# Patient Record
Sex: Male | Born: 1965
Health system: Southern US, Community
[De-identification: ages and names within clinical notes are randomized; demographics above are authoritative.]

## PROBLEM LIST (undated history)

## (undated) DIAGNOSIS — Z8619 Personal history of other infectious and parasitic diseases: Secondary | ICD-10-CM

## (undated) DIAGNOSIS — Z87442 Personal history of urinary calculi: Secondary | ICD-10-CM

## (undated) DIAGNOSIS — N189 Chronic kidney disease, unspecified: Secondary | ICD-10-CM

## (undated) DIAGNOSIS — F17200 Nicotine dependence, unspecified, uncomplicated: Secondary | ICD-10-CM

## (undated) HISTORY — DX: Personal history of other infectious and parasitic diseases: Z86.19

## (undated) HISTORY — DX: Nicotine dependence, unspecified, uncomplicated: F17.200

## (undated) HISTORY — DX: Chronic kidney disease, unspecified: N18.9

## (undated) HISTORY — PX: EYE SURGERY: SHX253

## (undated) HISTORY — DX: Personal history of urinary calculi: Z87.442

---

## 2010-12-08 DIAGNOSIS — Z87442 Personal history of urinary calculi: Secondary | ICD-10-CM

## 2010-12-08 HISTORY — DX: Personal history of urinary calculi: Z87.442

## 2011-11-09 ENCOUNTER — Emergency Department: Payer: Self-pay | Admitting: *Deleted

## 2013-03-08 HISTORY — PX: LITHOTRIPSY: SUR834

## 2013-03-17 ENCOUNTER — Ambulatory Visit: Payer: Self-pay | Admitting: Urology

## 2013-03-31 ENCOUNTER — Encounter: Payer: Self-pay | Admitting: Family Medicine

## 2013-03-31 ENCOUNTER — Ambulatory Visit (INDEPENDENT_AMBULATORY_CARE_PROVIDER_SITE_OTHER): Payer: BC Managed Care – PPO | Admitting: Family Medicine

## 2013-03-31 VITALS — BP 108/64 | HR 72 | Temp 98.4°F | Ht 69.5 in | Wt 155.2 lb

## 2013-03-31 DIAGNOSIS — Z87442 Personal history of urinary calculi: Secondary | ICD-10-CM

## 2013-03-31 DIAGNOSIS — Z87891 Personal history of nicotine dependence: Secondary | ICD-10-CM | POA: Insufficient documentation

## 2013-03-31 DIAGNOSIS — F17200 Nicotine dependence, unspecified, uncomplicated: Secondary | ICD-10-CM | POA: Insufficient documentation

## 2013-03-31 DIAGNOSIS — Z0000 Encounter for general adult medical examination without abnormal findings: Secondary | ICD-10-CM

## 2013-03-31 NOTE — Assessment & Plan Note (Signed)
Encouraged smoking cessation, discussed different methods to quit. Pt currently using e Cig. Provided with nicotrol inhaler samples.

## 2013-03-31 NOTE — Progress Notes (Signed)
Subjective:    Patient ID: Derrick Young, male    DOB: 06-16-1966, 47 y.o.   MRN: [id number]  HPI CC: new pt to establish, would like CPE  H/o kidney stones, started 11/2011.  Started left side, then had one on right side.  Latest 03/2013 s/p lithotripsy (Dr. Sheppard Penton).  Pt collected stones and pending analysis.  Intermittent lower back pain.  Improves with exercise.  Smoking - 1 ppd for 30 years.  Tried ecig, helps some.  May consider nicotine inhaler.  Preventative: No CPE in last 3 years Unsure last tetanus shot.  >10 yrs ago. Flu shots- doesn't take  sunscreen use discussed. Seat belt use discussed.  "[name]" From [place] Caffeine: 4-6 cups coffee/day Lives with wife, 3 children, 1 cat Occupation: Personal assistant, [employer]: PhD Activity: soccer Diet: seldom water, fruits/vegetables daily, seldom fast food, fish occasional  Medications and allergies reviewed and updated in chart.  Past histories reviewed and updated if relevant as below. There is no problem list on file for this patient.  Past Medical History  Diagnosis Date  . History of nephrolithiasis 2012  . History of chicken pox    Past Surgical History  Procedure Laterality Date  . Lithotripsy  03/2013   History  Substance Use Topics  . Smoking status: Current Every Day Smoker -- 1.00 packs/day    Types: Cigarettes    Start date: 12/09/1983  . Smokeless tobacco: Never Used  . Alcohol Use: No   Family History  Problem Relation Age of Onset  . Cancer Paternal Uncle     lung (smoker)  . Cancer Paternal Uncle     throat  . Emphysema Father     smoker  . Urolithiasis Mother   . CAD Neg Hx   . Stroke Neg Hx   . Diabetes Neg Hx   . Hypertension Neg Hx    No Known Allergies No current outpatient prescriptions on file prior to visit.   No current facility-administered medications on file prior to visit.     Review of Systems  Constitutional: Negative for fever, chills, activity change, appetite  change, fatigue and unexpected weight change.  HENT: Negative for hearing loss and neck pain.   Eyes: Negative for visual disturbance.  Respiratory: Negative for cough, chest tightness, shortness of breath and wheezing.   Cardiovascular: Negative for chest pain, palpitations and leg swelling.  Gastrointestinal: Positive for abdominal pain (recent kidney stones). Negative for nausea, vomiting, diarrhea, constipation, blood in stool and abdominal distention.  Genitourinary: Positive for hematuria (kidney sotne). Negative for difficulty urinating.  Musculoskeletal: Negative for myalgias and arthralgias.  Skin: Negative for rash.  Neurological: Negative for dizziness, seizures, syncope and headaches.  Hematological: Negative for adenopathy. Does not bruise/bleed easily.  Psychiatric/Behavioral: Negative for dysphoric mood. The patient is not nervous/anxious.        Objective:   Physical Exam  Nursing note and vitals reviewed. Constitutional: He is oriented to person, place, and time. He appears well-developed and well-nourished. No distress.  HENT:  Head: Normocephalic and atraumatic.  Right Ear: Hearing, tympanic membrane, external ear and ear canal normal.  Left Ear: Hearing, tympanic membrane, external ear and ear canal normal.  Nose: Nose normal.  Mouth/Throat: Oropharynx is clear and moist. No oropharyngeal exudate.  Eyes: Conjunctivae and EOM are normal. Pupils are equal, round, and reactive to light. No scleral icterus.  Neck: Normal range of motion. Neck supple. No thyromegaly present.  Cardiovascular: Normal rate, regular rhythm, normal heart sounds and intact distal  pulses.   No murmur heard. Pulses:      Radial pulses are 2+ on the right side, and 2+ on the left side.  Pulmonary/Chest: Effort normal and breath sounds normal. No respiratory distress. He has no wheezes. He has no rales.  Abdominal: Soft. Bowel sounds are normal. He exhibits no distension and no mass. There is no  tenderness. There is no rebound and no guarding.  Musculoskeletal: Normal range of motion. He exhibits no edema.  Lymphadenopathy:    He has no cervical adenopathy.  Neurological: He is alert and oriented to person, place, and time.  CN grossly intact, station and gait intact  Skin: Skin is warm and dry. No rash noted.  Psychiatric: He has a normal mood and affect. His behavior is normal. Judgment and thought content normal.       Assessment & Plan:

## 2013-03-31 NOTE — Assessment & Plan Note (Signed)
ABO/RH per pt request - aware insurance will likely not cover this. Preventative protocols reviewed and updated unless pt declined. Discussed healthy diet and lifestyle.

## 2013-03-31 NOTE — Assessment & Plan Note (Signed)
Followed by urology.   

## 2013-03-31 NOTE — Patient Instructions (Signed)
Good to see you today, call us with questions. Return at your convenience fasting for blood work. Nicotine inhaler provided today. Increase water in diet.

## 2013-04-04 ENCOUNTER — Other Ambulatory Visit: Payer: BC Managed Care – PPO

## 2013-04-07 ENCOUNTER — Other Ambulatory Visit (INDEPENDENT_AMBULATORY_CARE_PROVIDER_SITE_OTHER): Payer: BC Managed Care – PPO

## 2013-04-07 DIAGNOSIS — Z87442 Personal history of urinary calculi: Secondary | ICD-10-CM

## 2013-04-07 DIAGNOSIS — Z0000 Encounter for general adult medical examination without abnormal findings: Secondary | ICD-10-CM

## 2013-04-07 LAB — COMPREHENSIVE METABOLIC PANEL
AST: 16 U/L (ref 0–37)
BUN: 11 mg/dL (ref 6–23)
Calcium: 9.1 mg/dL (ref 8.4–10.5)
Chloride: 107 mEq/L (ref 96–112)
Creatinine, Ser: 0.9 mg/dL (ref 0.4–1.5)
Glucose, Bld: 89 mg/dL (ref 70–99)

## 2013-04-07 LAB — LIPID PANEL
Cholesterol: 186 mg/dL (ref 0–200)
LDL Cholesterol: 123 mg/dL — ABNORMAL HIGH (ref 0–99)
Triglycerides: 104 mg/dL (ref 0.0–149.0)
VLDL: 20.8 mg/dL (ref 0.0–40.0)

## 2013-04-08 LAB — ABO AND RH: Rh Type: POSITIVE

## 2013-04-11 ENCOUNTER — Encounter: Payer: Self-pay | Admitting: *Deleted

## 2015-10-01 ENCOUNTER — Ambulatory Visit (INDEPENDENT_AMBULATORY_CARE_PROVIDER_SITE_OTHER): Payer: BLUE CROSS/BLUE SHIELD | Admitting: Family Medicine

## 2015-10-01 ENCOUNTER — Encounter: Payer: Self-pay | Admitting: Family Medicine

## 2015-10-01 VITALS — BP 108/60 | HR 64 | Temp 97.7°F | Ht 69.5 in | Wt 154.5 lb

## 2015-10-01 DIAGNOSIS — F17200 Nicotine dependence, unspecified, uncomplicated: Secondary | ICD-10-CM

## 2015-10-01 DIAGNOSIS — Z13220 Encounter for screening for lipoid disorders: Secondary | ICD-10-CM

## 2015-10-01 DIAGNOSIS — Z131 Encounter for screening for diabetes mellitus: Secondary | ICD-10-CM | POA: Diagnosis not present

## 2015-10-01 DIAGNOSIS — Z0000 Encounter for general adult medical examination without abnormal findings: Secondary | ICD-10-CM | POA: Diagnosis not present

## 2015-10-01 DIAGNOSIS — Z23 Encounter for immunization: Secondary | ICD-10-CM

## 2015-10-01 LAB — LIPID PANEL
CHOL/HDL RATIO: 4
CHOLESTEROL: 170 mg/dL (ref 0–200)
HDL: 44.1 mg/dL (ref >39.00)
LDL CALC: 114 mg/dL — AB (ref 0–99)
NONHDL: 126.34
TRIGLYCERIDES: 60 mg/dL (ref 0.0–149.0)
VLDL: 12 mg/dL (ref 0.0–40.0)

## 2015-10-01 LAB — BASIC METABOLIC PANEL
BUN: 16 mg/dL (ref 6–23)
CALCIUM: 9.3 mg/dL (ref 8.4–10.5)
CO2: 29 mEq/L (ref 19–32)
Chloride: 108 mEq/L (ref 96–112)
Creatinine, Ser: 0.9 mg/dL (ref 0.40–1.50)
GFR: 95.67 mL/min (ref >60.00)
Glucose, Bld: 94 mg/dL (ref 70–99)
POTASSIUM: 4.3 meq/L (ref 3.5–5.1)
SODIUM: 142 meq/L (ref 135–145)

## 2015-10-01 NOTE — Addendum Note (Signed)
Addended by: Josph MachoANCE, Dina Mobley A on: 10/01/2015 09:17 AM   Modules accepted: Orders

## 2015-10-01 NOTE — Assessment & Plan Note (Signed)
Preventative protocols reviewed and updated unless pt declined. Discussed healthy diet and lifestyle.  

## 2015-10-01 NOTE — Patient Instructions (Addendum)
Tdap today. Flu shot today. labwork today Return as needed or in 1-2 years for next physical.  Health Maintenance, Male A healthy lifestyle and preventative care can promote health and wellness.  Maintain regular health, dental, and eye exams.  Eat a healthy diet. Foods like vegetables, fruits, whole grains, low-fat dairy products, and lean protein foods contain the nutrients you need and are low in calories. Decrease your intake of foods high in solid fats, added sugars, and salt. Get information about a proper diet from your health care provider, if necessary.  Regular physical exercise is one of the most important things you can do for your health. Most adults should get at least 150 minutes of moderate-intensity exercise (any activity that increases your heart rate and causes you to sweat) each week. In addition, most adults need muscle-strengthening exercises on 2 or more days a week.   Maintain a healthy weight. The body mass index (BMI) is a screening tool to identify possible weight problems. It provides an estimate of body fat based on height and weight. Your health care provider can find your BMI and can help you achieve or maintain a healthy weight. For males 20 years and older:  A BMI below 18.5 is considered underweight.  A BMI of 18.5 to 24.9 is normal.  A BMI of 25 to 29.9 is considered overweight.  A BMI of 30 and above is considered obese.  Maintain normal blood lipids and cholesterol by exercising and minimizing your intake of saturated fat. Eat a balanced diet with plenty of fruits and vegetables. Blood tests for lipids and cholesterol should begin at age 48 and be repeated every 5 years. If your lipid or cholesterol levels are high, you are over age 48, or you are at high risk for heart disease, you may need your cholesterol levels checked more frequently.Ongoing high lipid and cholesterol levels should be treated with medicines if diet and exercise are not  working.  If you smoke, find out from your health care provider how to quit. If you do not use tobacco, do not start.  Lung cancer screening is recommended for adults aged 55-80 years who are at high risk for developing lung cancer because of a history of smoking. A yearly low-dose CT scan of the lungs is recommended for people who have at least a 30-pack-year history of smoking and are current smokers or have quit within the past 15 years. A pack year of smoking is smoking an average of 1 pack of cigarettes a day for 1 year (for example, a 30-pack-year history of smoking could mean smoking 1 pack a day for 30 years or 2 packs a day for 15 years). Yearly screening should continue until the smoker has stopped smoking for at least 15 years. Yearly screening should be stopped for people who develop a health problem that would prevent them from having lung cancer treatment.  If you choose to drink alcohol, do not have more than 2 drinks per day. One drink is considered to be 12 oz (360 mL) of beer, 5 oz (150 mL) of wine, or 1.5 oz (45 mL) of liquor.  Avoid the use of street drugs. Do not share needles with anyone. Ask for help if you need support or instructions about stopping the use of drugs.  High blood pressure causes heart disease and increases the risk of stroke. High blood pressure is more likely to develop in:  People who have blood pressure in the end of the normal  range (100-139/85-89 mm Hg).  People who are overweight or obese.  People who are African American.  If you are 82-40 years of age, have your blood pressure checked every 3-5 years. If you are 57 years of age or older, have your blood pressure checked every year. You should have your blood pressure measured twice--once when you are at a hospital or clinic, and once when you are not at a hospital or clinic. Record the average of the two measurements. To check your blood pressure when you are not at a hospital or clinic, you can  use:  An automated blood pressure machine at a pharmacy.  A home blood pressure monitor.  If you are 55-66 years old, ask your health care provider if you should take aspirin to prevent heart disease.  Diabetes screening involves taking a blood sample to check your fasting blood sugar level. This should be done once every 3 years after age 14 if you are at a normal weight and without risk factors for diabetes. Testing should be considered at a younger age or be carried out more frequently if you are overweight and have at least 1 risk factor for diabetes.  Colorectal cancer can be detected and often prevented. Most routine colorectal cancer screening begins at the age of 47 and continues through age 15. However, your health care provider may recommend screening at an earlier age if you have risk factors for colon cancer. On a yearly basis, your health care provider may provide home test kits to check for hidden blood in the stool. A small camera at the end of a tube may be used to directly examine the colon (sigmoidoscopy or colonoscopy) to detect the earliest forms of colorectal cancer. Talk to your health care provider about this at age 51 when routine screening begins. A direct exam of the colon should be repeated every 5-10 years through age 56, unless early forms of precancerous polyps or small growths are found.  People who are at an increased risk for hepatitis B should be screened for this virus. You are considered at high risk for hepatitis B if:  You were born in a country where hepatitis B occurs often. Talk with your health care provider about which countries are considered high risk.  Your parents were born in a high-risk country and you have not received a shot to protect against hepatitis B (hepatitis B vaccine).  You have HIV or AIDS.  You use needles to inject street drugs.  You live with, or have sex with, someone who has hepatitis B.  You are a man who has sex with other  men (MSM).  You get hemodialysis treatment.  You take certain medicines for conditions like cancer, organ transplantation, and autoimmune conditions.  Hepatitis C blood testing is recommended for all people born from [birth year] through 1965 and any individual with known risk factors for hepatitis C.  Healthy men should no longer receive prostate-specific antigen (PSA) blood tests as part of routine cancer screening. Talk to your health care provider about prostate cancer screening.  Testicular cancer screening is not recommended for adolescents or adult males who have no symptoms. Screening includes self-exam, a health care provider exam, and other screening tests. Consult with your health care provider about any symptoms you have or any concerns you have about testicular cancer.  Practice safe sex. Use condoms and avoid high-risk sexual practices to reduce the spread of sexually transmitted infections (STIs).  You should be screened for STIs,  including gonorrhea and chlamydia if:  You are sexually active and are younger than 24 years.  You are older than 24 years, and your health care provider tells you that you are at risk for this type of infection.  Your sexual activity has changed since you were last screened, and you are at an increased risk for chlamydia or gonorrhea. Ask your health care provider if you are at risk.  If you are at risk of being infected with HIV, it is recommended that you take a prescription medicine daily to prevent HIV infection. This is called pre-exposure prophylaxis (PrEP). You are considered at risk if:  You are a man who has sex with other men (MSM).  You are a heterosexual man who is sexually active with multiple partners.  You take drugs by injection.  You are sexually active with a partner who has HIV.  Talk with your health care provider about whether you are at high risk of being infected with HIV. If you choose to begin PrEP, you should first be tested  for HIV. You should then be tested every 3 months for as long as you are taking PrEP.  Use sunscreen. Apply sunscreen liberally and repeatedly throughout the day. You should seek shade when your shadow is shorter than you. Protect yourself by wearing long sleeves, pants, a wide-brimmed hat, and sunglasses year round whenever you are outdoors.  Tell your health care provider of new moles or changes in moles, especially if there is a change in shape or color. Also, tell your health care provider if a mole is larger than the size of a pencil eraser.  A one-time screening for abdominal aortic aneurysm (AAA) and surgical repair of large AAAs by ultrasound is recommended for men aged 33-75 years who are current or former smokers.  Stay current with your vaccines (immunizations).   This information is not intended to replace advice given to you by your health care provider. Make sure you discuss any questions you have with your health care provider.   Document Released: 05/22/2008 Document Revised: 12/15/2014 Document Reviewed: 04/21/2011 Elsevier Interactive Patient Education [employer].

## 2015-10-01 NOTE — Progress Notes (Signed)
Pre visit review using our clinic review tool, if applicable. No additional management support is needed unless otherwise documented below in the visit note. 

## 2015-10-01 NOTE — Progress Notes (Signed)
BP 108/60 mmHg  Pulse 64  Temp(Src) 97.7 F (36.5 C) (Oral)  Ht 5' 9.5" (1.765 m)  Wt 154 lb 8 oz (70.081 kg)  BMI 22.50 kg/m2   CC: CPE  Subjective:    Patient ID: Derrick Young, male    DOB: 12/29/1965, 49 y.o.   MRN: [id number]  HPI: Derrick HopeSalem Giambra is a 49 y.o. male presenting on 10/01/2015 for Annual Exam   Feeling under the weather today - 3d h/o congestion, sore throat, cough. Doesn't frequently get colds. Some ongoing smoker's cough with mild phlegm production. Discussed possible spirometry in the future.  Smoking - 1 ppd for 30 years. Using vapor products.   Preventative: fmhx lung cancer, pt smoker. Will qualify for lung cancer screening age 48yo. Tdap today Flu shots- today.  Seat belt use discussed. No changing moles on the skin.   "[name]" From [place] Caffeine: 4-6 cups coffee/day Lives with wife, 3 children, 1 cat Occupation: Personal assistantlab manager, [employer]: PhD  Activity: soccer  Diet: seldom water, fruits/vegetables daily, seldom fast food, fish occasional   Relevant past medical, surgical, family and social history reviewed and updated as indicated. Interim medical history since our last visit reviewed. Allergies and medications reviewed and updated. No current outpatient prescriptions on file prior to visit.   No current facility-administered medications on file prior to visit.    Review of Systems  Constitutional: Negative for fever, chills, activity change, appetite change, fatigue and unexpected weight change.  HENT: Negative for hearing loss.   Eyes: Negative for visual disturbance.  Respiratory: Positive for cough (intermittent ?smoker's - with phlegm ) and wheezing (rare). Negative for chest tightness and shortness of breath.   Cardiovascular: Negative for chest pain, palpitations and leg swelling.  Gastrointestinal: Negative for nausea, vomiting, abdominal pain, diarrhea, constipation, blood in stool and abdominal distention.  Genitourinary:  Negative for hematuria and difficulty urinating.  Musculoskeletal: Negative for myalgias, arthralgias and neck pain.  Skin: Negative for rash.  Neurological: Negative for dizziness, seizures, syncope and headaches.  Hematological: Negative for adenopathy. Does not bruise/bleed easily.  Psychiatric/Behavioral: Negative for dysphoric mood. The patient is not nervous/anxious.    Per HPI unless specifically indicated in ROS section     Objective:    BP 108/60 mmHg  Pulse 64  Temp(Src) 97.7 F (36.5 C) (Oral)  Ht 5' 9.5" (1.765 m)  Wt 154 lb 8 oz (70.081 kg)  BMI 22.50 kg/m2  Wt Readings from Last 3 Encounters:  10/01/15 154 lb 8 oz (70.081 kg)  03/31/13 155 lb 4 oz (70.421 kg)    Physical Exam  Constitutional: He is oriented to person, place, and time. He appears well-developed and well-nourished. No distress.  HENT:  Head: Normocephalic and atraumatic.  Right Ear: Hearing, tympanic membrane, external ear and ear canal normal.  Left Ear: Hearing, tympanic membrane, external ear and ear canal normal.  Nose: Nose normal.  Mouth/Throat: Uvula is midline, oropharynx is clear and moist and mucous membranes are normal. No oropharyngeal exudate, posterior oropharyngeal edema or posterior oropharyngeal erythema.  Eyes: Conjunctivae and EOM are normal. Pupils are equal, round, and reactive to light. No scleral icterus.  Neck: Normal range of motion. Neck supple. No thyromegaly present.  Cardiovascular: Normal rate, regular rhythm, normal heart sounds and intact distal pulses.   No murmur heard. Pulses:      Radial pulses are 2+ on the right side, and 2+ on the left side.  Pulmonary/Chest: Effort normal and breath sounds normal. No respiratory distress.  He has no wheezes. He has no rales.  Abdominal: Soft. Bowel sounds are normal. He exhibits no distension and no mass. There is no tenderness. There is no rebound and no guarding.  Musculoskeletal: Normal range of motion. He exhibits no edema.   Lymphadenopathy:    He has no cervical adenopathy.  Neurological: He is alert and oriented to person, place, and time.  CN grossly intact, station and gait intact  Skin: Skin is warm and dry. No rash noted.  Psychiatric: He has a normal mood and affect. His behavior is normal. Judgment and thought content normal.  Nursing note and vitals reviewed.  Results for orders placed or performed in visit on 04/07/13  Lipid panel  Result Value Ref Range   Cholesterol 186 0 - 200 mg/dL   Triglycerides 161.0 0.0 - 149.0 mg/dL   HDL 96.04 >54.09 mg/dL   VLDL 81.1 0.0 - 91.4 mg/dL   LDL Cholesterol 782 (H) 0 - 99 mg/dL   Total CHOL/HDL Ratio 4   Comprehensive metabolic panel  Result Value Ref Range   Sodium 138 135 - 145 mEq/L   Potassium 3.9 3.5 - 5.1 mEq/L   Chloride 107 96 - 112 mEq/L   CO2 27 19 - 32 mEq/L   Glucose, Bld 89 70 - 99 mg/dL   BUN 11 6 - 23 mg/dL   Creatinine, Ser 0.9 0.4 - 1.5 mg/dL   Total Bilirubin 0.7 0.3 - 1.2 mg/dL   Alkaline Phosphatase 41 39 - 117 U/L   AST 16 0 - 37 U/L   ALT 14 0 - 53 U/L   Total Protein 6.8 6.0 - 8.3 g/dL   Albumin 3.9 3.5 - 5.2 g/dL   Calcium 9.1 8.4 - 95.6 mg/dL   GFR 21.30 >86.57 mL/min  ABO AND RH   Result Value Ref Range   ABO Grouping O    Rh Type POS       Assessment & Plan:   Problem List Items Addressed This Visit    Smoker    Continue to encourage cessation. Contemplative.  Discussed return for spirometry if any dyspnea.      Healthcare maintenance - Primary    Preventative protocols reviewed and updated unless pt declined. Discussed healthy diet and lifestyle.        Other Visit Diagnoses    Screening, lipid        Relevant Orders    Lipid panel    Screening for diabetes mellitus        Relevant Orders    Basic metabolic panel        Follow up plan: Return in about 2 years (around 09/30/2017), or as needed, for annual exam, prior fasting for blood work.

## 2015-10-01 NOTE — Assessment & Plan Note (Addendum)
Continue to encourage cessation. Contemplative.  Discussed return for spirometry if any dyspnea.

## 2016-11-08 DIAGNOSIS — M940 Chondrocostal junction syndrome [Tietze]: Secondary | ICD-10-CM | POA: Diagnosis not present

## 2016-12-16 ENCOUNTER — Encounter: Payer: Self-pay | Admitting: Family Medicine

## 2016-12-16 ENCOUNTER — Ambulatory Visit (INDEPENDENT_AMBULATORY_CARE_PROVIDER_SITE_OTHER): Payer: BLUE CROSS/BLUE SHIELD | Admitting: Family Medicine

## 2016-12-16 VITALS — BP 100/60 | HR 70 | Temp 98.3°F | Wt 155.2 lb

## 2016-12-16 DIAGNOSIS — M545 Low back pain, unspecified: Secondary | ICD-10-CM

## 2016-12-16 DIAGNOSIS — S76211A Strain of adductor muscle, fascia and tendon of right thigh, initial encounter: Secondary | ICD-10-CM | POA: Diagnosis not present

## 2016-12-16 DIAGNOSIS — M79604 Pain in right leg: Secondary | ICD-10-CM | POA: Insufficient documentation

## 2016-12-16 NOTE — Assessment & Plan Note (Signed)
New, doesn't remember mechanism of action.  Supportive care discussed, [facility] provided today. Update if not improving with treatment.

## 2016-12-16 NOTE — Progress Notes (Signed)
Pre visit review using our clinic review tool, if applicable. No additional management support is needed unless otherwise documented below in the visit note. 

## 2016-12-16 NOTE — Progress Notes (Signed)
   BP 100/60   Pulse 70   Temp 98.3 F (36.8 C) (Oral)   Wt 155 lb 4 oz (70.4 kg)   BMI 22.60 kg/m    CC: back and R leg pain Subjective:    Patient ID: Derrick Young, male    DOB: 02/24/1966, 51 y.o.   MRN: [id number]  HPI: Derrick Young is a 51 y.o. male presenting on 12/16/2016 for Back Pain (and pain right leg)   Pain started R LBP over last 1+ month, awoke in bed and while turning felt shooting pain down R leg to ankles "electric pain". Pain worse in the morning, also noted with prolonged standing (throbbing pain). Some improvement over the past week but now with inner thigh pain. Treating with ibuprofen 800mg  PRN which helps.   Denies inciting trauma/injury. No numbness/weakness of legs. No fevers/chills. No bowel/bladder accidents.   Plays soccer to stay active. Played yesterday. This pain has somewhat limited his activity.   No h/o leg or back surgeries.   Relevant past medical, surgical, family and social history reviewed and updated as indicated. Interim medical history since our last visit reviewed. Allergies and medications reviewed and updated. No current outpatient prescriptions on file prior to visit.   No current facility-administered medications on file prior to visit.     Review of Systems Per HPI unless specifically indicated in ROS section     Objective:    BP 100/60   Pulse 70   Temp 98.3 F (36.8 C) (Oral)   Wt 155 lb 4 oz (70.4 kg)   BMI 22.60 kg/m   Wt Readings from Last 3 Encounters:  12/16/16 155 lb 4 oz (70.4 kg)  10/01/15 154 lb 8 oz (70.1 kg)  03/31/13 155 lb 4 oz (70.4 kg)    Physical Exam  Constitutional: He is oriented to person, place, and time. He appears well-developed and well-nourished. No distress.  Abdominal: Hernia confirmed negative in the right inguinal area and confirmed negative in the left inguinal area.  Musculoskeletal: He exhibits no edema.  No pain midline spine No paraspinous mm tenderness Neg SLR  bilaterally. No pain with int/ext rotation at hip. Neg FABER. No pain at SIJ, GTB or sciatic notch bilaterally.  No pain or bruising inner groin. Tender with testing of hip abductors/adductors  Neurological: He is alert and oriented to person, place, and time.  5/5 strength BLE  Skin: Skin is warm and dry. No rash noted.  Nursing note and vitals reviewed.     Assessment & Plan:   Problem List Items Addressed This Visit    Lumbar pain with radiation down right leg    Anticipate lumbar strain with sciatica - now improving. Supportive care discussed. Update if recurrent or worsening.      Relevant Medications   ibuprofen (ADVIL,MOTRIN) 200 MG tablet   Strain of groin, right, initial encounter - Primary    New, doesn't remember mechanism of action.  Supportive care discussed, groin strain exercises from [facility] advisor provided today. Update if not improving with treatment.           Follow up plan: No Follow-up on file.  Eustaquio BoydenJavier Mariapaula Krist, MD

## 2016-12-16 NOTE — Assessment & Plan Note (Signed)
Anticipate lumbar strain with sciatica - now improving. Supportive care discussed. Update if recurrent or worsening.

## 2017-01-07 ENCOUNTER — Encounter: Payer: Self-pay | Admitting: Family Medicine

## 2017-01-07 ENCOUNTER — Ambulatory Visit (INDEPENDENT_AMBULATORY_CARE_PROVIDER_SITE_OTHER): Payer: BLUE CROSS/BLUE SHIELD | Admitting: Family Medicine

## 2017-01-07 VITALS — BP 108/60 | HR 68 | Temp 98.2°F | Ht 69.5 in | Wt 159.8 lb

## 2017-01-07 DIAGNOSIS — B351 Tinea unguium: Secondary | ICD-10-CM

## 2017-01-07 DIAGNOSIS — F17200 Nicotine dependence, unspecified, uncomplicated: Secondary | ICD-10-CM

## 2017-01-07 DIAGNOSIS — S76211A Strain of adductor muscle, fascia and tendon of right thigh, initial encounter: Secondary | ICD-10-CM

## 2017-01-07 DIAGNOSIS — Z0000 Encounter for general adult medical examination without abnormal findings: Secondary | ICD-10-CM

## 2017-01-07 MED ORDER — TERBINAFINE HCL 250 MG PO TABS: 250.0000 mg | ORAL_TABLET | Freq: Every day | ORAL | 0 refills | Status: DC

## 2017-01-07 NOTE — Patient Instructions (Addendum)
You are doing well. Stay active, work on water.  Treat fungal nail infection with lamisil medicine daily. Let us know effect.   Health Maintenance, Male A healthy lifestyle and preventative care can promote health and wellness.  Maintain regular health, dental, and eye exams.  Eat a healthy diet. Foods like vegetables, fruits, whole grains, low-fat dairy products, and lean protein foods contain the nutrients you need and are low in calories. Decrease your intake of foods high in solid fats, added sugars, and salt. Get information about a proper diet from your health care provider, if necessary.  Regular physical exercise is one of the most important things you can do for your health. Most adults should get at least 150 minutes of moderate-intensity exercise (any activity that increases your heart rate and causes you to sweat) each week. In addition, most adults need muscle-strengthening exercises on 2 or more days a week.   Maintain a healthy weight. The body mass index (BMI) is a screening tool to identify possible weight problems. It provides an estimate of body fat based on height and weight. Your health care provider can find your BMI and can help you achieve or maintain a healthy weight. For males 20 years and older:  A BMI below 18.5 is considered underweight.  A BMI of 18.5 to 24.9 is normal.  A BMI of 25 to 29.9 is considered overweight.  A BMI of 30 and above is considered obese.  Maintain normal blood lipids and cholesterol by exercising and minimizing your intake of saturated fat. Eat a balanced diet with plenty of fruits and vegetables. Blood tests for lipids and cholesterol should begin at age 50 and be repeated every 5 years. If your lipid or cholesterol levels are high, you are over age 750, or you are at high risk for heart disease, you may need your cholesterol levels checked more frequently.Ongoing high lipid and cholesterol levels should be treated with medicines if diet  and exercise are not working.  If you smoke, find out from your health care provider how to quit. If you do not use tobacco, do not start.  Lung cancer screening is recommended for adults aged 55-80 years who are at high risk for developing lung cancer because of a history of smoking. A yearly low-dose CT scan of the lungs is recommended for people who have at least a 30-pack-year history of smoking and are current smokers or have quit within the past 15 years. A pack year of smoking is smoking an average of 1 pack of cigarettes a day for 1 year (for example, a 30-pack-year history of smoking could mean smoking 1 pack a day for 30 years or 2 packs a day for 15 years). Yearly screening should continue until the smoker has stopped smoking for at least 15 years. Yearly screening should be stopped for people who develop a health problem that would prevent them from having lung cancer treatment.  If you choose to drink alcohol, do not have more than 2 drinks per day. One drink is considered to be 12 oz (360 mL) of beer, 5 oz (150 mL) of wine, or 1.5 oz (45 mL) of liquor.  Avoid the use of street drugs. Do not share needles with anyone. Ask for help if you need support or instructions about stopping the use of drugs.  High blood pressure causes heart disease and increases the risk of stroke. High blood pressure is more likely to develop in:  People who have blood pressure  in the end of the normal range (100-139/85-89 mm Hg).  People who are overweight or obese.  People who are African American.  If you are 58-21 years of age, have your blood pressure checked every 3-5 years. If you are 78 years of age or older, have your blood pressure checked every year. You should have your blood pressure measured twice-once when you are at a hospital or clinic, and once when you are not at a hospital or clinic. Record the average of the two measurements. To check your blood pressure when you are not at a hospital or  clinic, you can use:  An automated blood pressure machine at a pharmacy.  A home blood pressure monitor.  If you are 3-84 years old, ask your health care provider if you should take aspirin to prevent heart disease.  Diabetes screening involves taking a blood sample to check your fasting blood sugar level. This should be done once every 3 years after age 70 if you are at a normal weight and without risk factors for diabetes. Testing should be considered at a younger age or be carried out more frequently if you are overweight and have at least 1 risk factor for diabetes.  Colorectal cancer can be detected and often prevented. Most routine colorectal cancer screening begins at the age of 41 and continues through age 57. However, your health care provider may recommend screening at an earlier age if you have risk factors for colon cancer. On a yearly basis, your health care provider may provide home test kits to check for hidden blood in the stool. A small camera at the end of a tube may be used to directly examine the colon (sigmoidoscopy or colonoscopy) to detect the earliest forms of colorectal cancer. Talk to your health care provider about this at age 9 when routine screening begins. A direct exam of the colon should be repeated every 5-10 years through age 65, unless early forms of precancerous polyps or small growths are found.  People who are at an increased risk for hepatitis B should be screened for this virus. You are considered at high risk for hepatitis B if:  You were born in a country where hepatitis B occurs often. Talk with your health care provider about which countries are considered high risk.  Your parents were born in a high-risk country and you have not received a shot to protect against hepatitis B (hepatitis B vaccine).  You have HIV or AIDS.  You use needles to inject street drugs.  You live with, or have sex with, someone who has hepatitis B.  You are a man who has  sex with other men (MSM).  You get hemodialysis treatment.  You take certain medicines for conditions like cancer, organ transplantation, and autoimmune conditions.  Hepatitis C blood testing is recommended for all people born from [birth year] through 1964 and any individual with known risk factors for hepatitis C.  Healthy men should no longer receive prostate-specific antigen (PSA) blood tests as part of routine cancer screening. Talk to your health care provider about prostate cancer screening.  Testicular cancer screening is not recommended for adolescents or adult males who have no symptoms. Screening includes self-exam, a health care provider exam, and other screening tests. Consult with your health care provider about any symptoms you have or any concerns you have about testicular cancer.  Practice safe sex. Use condoms and avoid high-risk sexual practices to reduce the spread of sexually transmitted infections (STIs).  You should be screened for STIs, including gonorrhea and chlamydia if:  You are sexually active and are younger than 24 years.  You are older than 24 years, and your health care provider tells you that you are at risk for this type of infection.  Your sexual activity has changed since you were last screened, and you are at an increased risk for chlamydia or gonorrhea. Ask your health care provider if you are at risk.  If you are at risk of being infected with HIV, it is recommended that you take a prescription medicine daily to prevent HIV infection. This is called pre-exposure prophylaxis (PrEP). You are considered at risk if:  You are a man who has sex with other men (MSM).  You are a heterosexual man who is sexually active with multiple partners.  You take drugs by injection.  You are sexually active with a partner who has HIV.  Talk with your health care provider about whether you are at high risk of being infected with HIV. If you choose to begin PrEP, you should  first be tested for HIV. You should then be tested every 3 months for as long as you are taking PrEP.  Use sunscreen. Apply sunscreen liberally and repeatedly throughout the day. You should seek shade when your shadow is shorter than you. Protect yourself by wearing long sleeves, pants, a wide-brimmed hat, and sunglasses year round whenever you are outdoors.  Tell your health care provider of new moles or changes in moles, especially if there is a change in shape or color. Also, tell your health care provider if a mole is larger than the size of a pencil eraser.  A one-time screening for abdominal aortic aneurysm (AAA) and surgical repair of large AAAs by ultrasound is recommended for men aged 64-75 years who are current or former smokers.  Stay current with your vaccines (immunizations). This information is not intended to replace advice given to you by your health care provider. Make sure you discuss any questions you have with your health care provider. Document Released: 05/22/2008 Document Revised: 12/15/2014 Document Reviewed: 08/28/2015 Elsevier Interactive Patient Education  2017 [employer].

## 2017-01-07 NOTE — Assessment & Plan Note (Signed)
Treat with 6 wk lamisil course. Update with effect, discussed possible rpt if needed, would check LFTs if needed.

## 2017-01-07 NOTE — Assessment & Plan Note (Addendum)
Continue to encourage full cessation. Has cut down. contemplative

## 2017-01-07 NOTE — Progress Notes (Signed)
Pre visit review using our clinic review tool, if applicable. No additional management support is needed unless otherwise documented below in the visit note. 

## 2017-01-07 NOTE — Progress Notes (Signed)
BP 108/60   Pulse 68   Temp 98.2 F (36.8 C) (Oral)   Ht 5' 9.5" (1.765 m)   Wt 159 lb 12 oz (72.5 kg)   BMI 23.25 kg/m    CC: CPE Subjective:    Patient ID: Derrick Young, male    DOB: 03/06/1966, 51 y.o.   MRN: [id number]  HPI: Derrick HopeSalem Aschenbrenner is a 51 y.o. male presenting on 01/07/2017 for Annual Exam   Seen here earlier this month with lumbar pain and R groin pain - improving.  Recurrent onychomycosis over the past year R foot - would like treatment.   Preventative: Fmhx lung cancer, pt smoker. Will qualify for lung cancer screening age 50yo. Flu shots- today.  Tdap today Seat belt use discussed. Sunscreen use discussed. No changing moles on the skin.  Smoking - cut down to 1/2 ppd. Some vapes Alcohol - none  "[name]" [place]: 4-6 cups coffee/day  Lives with wife, 3 children, 1 cat Occupation: Personal assistantlab manager, [employer]: PhD  Activity: soccer  Diet: seldom water, fruits/vegetables daily, seldom fast food, fish occasional   Relevant past medical, surgical, family and social history reviewed and updated as indicated. Interim medical history since our last visit reviewed. Allergies and medications reviewed and updated. Current Outpatient Prescriptions on File Prior to Visit  Medication Sig  . ibuprofen (ADVIL,MOTRIN) 200 MG tablet Take 200 mg by mouth every 6 (six) hours as needed.   No current facility-administered medications on file prior to visit.     Review of Systems  Constitutional: Negative for activity change, appetite change, chills, fatigue, fever and unexpected weight change.  HENT: Negative for hearing loss.   Eyes: Negative for visual disturbance.  Respiratory: Negative for cough, chest tightness, shortness of breath and wheezing.   Cardiovascular: Negative for chest pain, palpitations and leg swelling.  Gastrointestinal: Negative for abdominal distention, abdominal pain, blood in stool, constipation, diarrhea, nausea and vomiting.    Genitourinary: Negative for difficulty urinating and hematuria.  Musculoskeletal: Negative for arthralgias, myalgias and neck pain.  Skin: Negative for rash.  Neurological: Negative for dizziness, seizures, syncope and headaches.  Hematological: Negative for adenopathy. Does not bruise/bleed easily.  Psychiatric/Behavioral: Negative for dysphoric mood. The patient is not nervous/anxious.    Per HPI unless specifically indicated in ROS section     Objective:    BP 108/60   Pulse 68   Temp 98.2 F (36.8 C) (Oral)   Ht 5' 9.5" (1.765 m)   Wt 159 lb 12 oz (72.5 kg)   BMI 23.25 kg/m   Wt Readings from Last 3 Encounters:  01/07/17 159 lb 12 oz (72.5 kg)  12/16/16 155 lb 4 oz (70.4 kg)  10/01/15 154 lb 8 oz (70.1 kg)    Physical Exam  Constitutional: He is oriented to person, place, and time. He appears well-developed and well-nourished. No distress.  HENT:  Head: Normocephalic and atraumatic.  Right Ear: Hearing, tympanic membrane, external ear and ear canal normal.  Left Ear: Hearing, tympanic membrane, external ear and ear canal normal.  Nose: Nose normal.  Mouth/Throat: Uvula is midline, oropharynx is clear and moist and mucous membranes are normal. No oropharyngeal exudate, posterior oropharyngeal edema or posterior oropharyngeal erythema.  Eyes: Conjunctivae and EOM are normal. Pupils are equal, round, and reactive to light. No scleral icterus.  Neck: Normal range of motion. Neck supple. No thyromegaly present.  Cardiovascular: Normal rate, regular rhythm, normal heart sounds and intact distal pulses.   No murmur heard.  Pulses:      Radial pulses are 2+ on the right side, and 2+ on the left side.  Pulmonary/Chest: Effort normal and breath sounds normal. No respiratory distress. He has no wheezes. He has no rales.  Abdominal: Soft. Bowel sounds are normal. He exhibits no distension and no mass. There is no tenderness. There is no rebound and no guarding.  Musculoskeletal:  Normal range of motion. He exhibits no edema.  Lymphadenopathy:    He has no cervical adenopathy.  Neurological: He is alert and oriented to person, place, and time.  CN grossly intact, station and gait intact  Skin: Skin is warm and dry. No rash noted.  Thickened onychomycotic nails R foot  Psychiatric: He has a normal mood and affect. His behavior is normal. Judgment and thought content normal.  Nursing note and vitals reviewed.  Results for orders placed or performed in visit on 10/01/15  Lipid panel  Result Value Ref Range   Cholesterol 170 0 - 200 mg/dL   Triglycerides 16.1 0.0 - 149.0 mg/dL   HDL 09.60 >45.40 mg/dL   VLDL 98.1 0.0 - 19.1 mg/dL   LDL Cholesterol 478 (H) 0 - 99 mg/dL   Total CHOL/HDL Ratio 4    NonHDL 126.34   Basic metabolic panel  Result Value Ref Range   Sodium 142 135 - 145 mEq/L   Potassium 4.3 3.5 - 5.1 mEq/L   Chloride 108 96 - 112 mEq/L   CO2 29 19 - 32 mEq/L   Glucose, Bld 94 70 - 99 mg/dL   BUN 16 6 - 23 mg/dL   Creatinine, Ser 2.95 0.40 - 1.50 mg/dL   Calcium 9.3 8.4 - 62.1 mg/dL   GFR 30.86 >57.84 mL/min      Assessment & Plan:   Problem List Items Addressed This Visit    Healthcare maintenance - Primary    Preventative protocols reviewed and updated unless pt declined. Discussed healthy diet and lifestyle.       Onychomycosis    Treat with 6 wk lamisil course. Update with effect, discussed possible rpt if needed, would check LFTs if needed.       Relevant Medications   terbinafine (LAMISIL) 250 MG tablet   Smoker    Continue to encourage full cessation. Has cut down. contemplative      Strain of groin, right, initial encounter    Improving.           Follow up plan: Return in about 1 year (around 01/07/2018) for annual exam, prior fasting for blood work.  [name], MD

## 2017-01-07 NOTE — Assessment & Plan Note (Addendum)
Improving.

## 2017-01-07 NOTE — Assessment & Plan Note (Signed)
Preventative protocols reviewed and updated unless pt declined. Discussed healthy diet and lifestyle.  

## 2017-02-18 ENCOUNTER — Other Ambulatory Visit: Payer: Self-pay | Admitting: Family Medicine

## 2017-12-09 ENCOUNTER — Encounter: Payer: BLUE CROSS/BLUE SHIELD | Admitting: Family Medicine

## 2018-01-05 ENCOUNTER — Ambulatory Visit (INDEPENDENT_AMBULATORY_CARE_PROVIDER_SITE_OTHER): Payer: BLUE CROSS/BLUE SHIELD | Admitting: Family Medicine

## 2018-01-05 ENCOUNTER — Encounter: Payer: Self-pay | Admitting: Family Medicine

## 2018-01-05 VITALS — BP 124/78 | HR 62 | Temp 98.3°F | Ht 69.0 in | Wt 156.5 lb

## 2018-01-05 DIAGNOSIS — Z1211 Encounter for screening for malignant neoplasm of colon: Secondary | ICD-10-CM | POA: Diagnosis not present

## 2018-01-05 DIAGNOSIS — B351 Tinea unguium: Secondary | ICD-10-CM

## 2018-01-05 DIAGNOSIS — E785 Hyperlipidemia, unspecified: Secondary | ICD-10-CM | POA: Diagnosis not present

## 2018-01-05 DIAGNOSIS — Z125 Encounter for screening for malignant neoplasm of prostate: Secondary | ICD-10-CM

## 2018-01-05 DIAGNOSIS — F17200 Nicotine dependence, unspecified, uncomplicated: Secondary | ICD-10-CM

## 2018-01-05 DIAGNOSIS — Z0000 Encounter for general adult medical examination without abnormal findings: Secondary | ICD-10-CM

## 2018-01-05 DIAGNOSIS — Z23 Encounter for immunization: Secondary | ICD-10-CM

## 2018-01-05 LAB — LIPID PANEL
CHOLESTEROL: 201 mg/dL — AB (ref 0–200)
HDL: 52 mg/dL (ref >39.00)
LDL CALC: 117 mg/dL — AB (ref 0–99)
NonHDL: 149.42
Total CHOL/HDL Ratio: 4
Triglycerides: 162 mg/dL — ABNORMAL HIGH (ref 0.0–149.0)
VLDL: 32.4 mg/dL (ref 0.0–40.0)

## 2018-01-05 LAB — PSA: PSA: 1.16 ng/mL (ref 0.10–4.00)

## 2018-01-05 LAB — COMPREHENSIVE METABOLIC PANEL
ALBUMIN: 4.2 g/dL (ref 3.5–5.2)
ALT: 15 U/L (ref 0–53)
AST: 16 U/L (ref 0–37)
Alkaline Phosphatase: 47 U/L (ref 39–117)
BUN: 10 mg/dL (ref 6–23)
CALCIUM: 9.4 mg/dL (ref 8.4–10.5)
CHLORIDE: 104 meq/L (ref 96–112)
CO2: 32 mEq/L (ref 19–32)
Creatinine, Ser: 0.89 mg/dL (ref 0.40–1.50)
GFR: 96.01 mL/min (ref >60.00)
Glucose, Bld: 94 mg/dL (ref 70–99)
POTASSIUM: 4.6 meq/L (ref 3.5–5.1)
SODIUM: 140 meq/L (ref 135–145)
Total Bilirubin: 0.5 mg/dL (ref 0.2–1.2)
Total Protein: 6.8 g/dL (ref 6.0–8.3)

## 2018-01-05 MED ORDER — TERBINAFINE HCL 250 MG PO TABS: 250.0000 mg | ORAL_TABLET | Freq: Every day | ORAL | 1 refills | Status: DC

## 2018-01-05 NOTE — Addendum Note (Signed)
Addended by: Nanci PinaGOINS, Oaklie Durrett on: 01/05/2018 12:09 PM   Modules accepted: Orders

## 2018-01-05 NOTE — Progress Notes (Signed)
BP 124/78 (BP Location: Right Arm, Patient Position: Sitting, Cuff Size: Normal)   Pulse 62   Temp 98.3 F (36.8 C) (Oral)   Ht 5\' 9"  (1.753 m)   Wt 156 lb 8 oz (71 kg)   SpO2 99%   BMI 23.11 kg/m    CC: CPE Subjective:    Patient ID: Derrick Young, male    DOB: 05/16/1966, 52 y.o.   MRN: [id number]  HPI: Derrick Young is a 52 y.o. male presenting on 01/05/2018 for Annual Exam   Onychomycosis - treated x 6 wks with improvement but persistent. Requests refill.   Preventative: Colon cancer screening - discussed, would like colonoscopy Prostate cancer screening - reviewed. Would like to screen. Fmhx lung cancer (father age 51), pt smoker. Will qualify for lung cancer screening age 52yo. >30 PY hx.  Flu shot - today Tdap today Seat belt use discussed. Sunscreen use discussed. No changing moles on the skin.  Smoking - stopped 11/2017. Down to E-cig. Plans to stop E-cig by 2020. 30+ PY hx  Alcohol - none  "[name]" [place]: 4-6 cups coffee/day  Lives with wife, 3 children, 1 cat Occupation: Personal assistantlab manager, [employer]: PhD  Activity: soccer  Diet: seldom water, fruits/vegetables daily, seldom fast food, fish occasional   Relevant past medical, surgical, family and social history reviewed and updated as indicated. Interim medical history since our last visit reviewed. Allergies and medications reviewed and updated. Outpatient Medications Prior to Visit  Medication Sig Dispense Refill  . ibuprofen (ADVIL,MOTRIN) 200 MG tablet Take 200 mg by mouth every 6 (six) hours as needed.    . terbinafine (LAMISIL) 250 MG tablet Take 1 tablet (250 mg total) by mouth daily. (Patient not taking: Reported on 01/05/2018) 45 tablet 0   No facility-administered medications prior to visit.      Per HPI unless specifically indicated in ROS section below Review of Systems  Constitutional: Negative for activity change, appetite change, chills, fatigue, fever and unexpected  weight change.  HENT: Negative for hearing loss.   Eyes: Negative for visual disturbance.  Respiratory: Negative for cough, chest tightness, shortness of breath and wheezing.   Cardiovascular: Negative for chest pain, palpitations and leg swelling.  Gastrointestinal: Negative for abdominal distention, abdominal pain, blood in stool, constipation, diarrhea, nausea and vomiting.  Genitourinary: Negative for difficulty urinating and hematuria.  Musculoskeletal: Negative for arthralgias, myalgias and neck pain.  Skin: Negative for rash.  Neurological: Negative for dizziness, seizures, syncope and headaches.  Hematological: Negative for adenopathy. Does not bruise/bleed easily.  Psychiatric/Behavioral: Negative for dysphoric mood. The patient is not nervous/anxious.        Objective:    BP 124/78 (BP Location: Right Arm, Patient Position: Sitting, Cuff Size: Normal)   Pulse 62   Temp 98.3 F (36.8 C) (Oral)   Ht 5\' 9"  (1.753 m)   Wt 156 lb 8 oz (71 kg)   SpO2 99%   BMI 23.11 kg/m   Wt Readings from Last 3 Encounters:  01/05/18 156 lb 8 oz (71 kg)  01/07/17 159 lb 12 oz (72.5 kg)  12/16/16 155 lb 4 oz (70.4 kg)    Physical Exam  Constitutional: He is oriented to person, place, and time. He appears well-developed and well-nourished. No distress.  HENT:  Head: Normocephalic and atraumatic.  Right Ear: Hearing, tympanic membrane, external ear and ear canal normal.  Left Ear: Hearing, tympanic membrane, external ear and ear canal normal.  Nose: Nose normal.  Mouth/Throat: Uvula  is midline, oropharynx is clear and moist and mucous membranes are normal. No oropharyngeal exudate, posterior oropharyngeal edema or posterior oropharyngeal erythema.  Eyes: Conjunctivae and EOM are normal. Pupils are equal, round, and reactive to light. No scleral icterus.  Neck: Normal range of motion. Neck supple.  Cardiovascular: Normal rate, regular rhythm, normal heart sounds and intact distal pulses.    No murmur heard. Pulses:      Radial pulses are 2+ on the right side, and 2+ on the left side.  Pulmonary/Chest: Effort normal and breath sounds normal. No respiratory distress. He has no wheezes. He has no rales.  Abdominal: Soft. Bowel sounds are normal. He exhibits no distension and no mass. There is no tenderness. There is no rebound and no guarding.  Genitourinary: Rectum normal and prostate normal. Rectal exam shows no external hemorrhoid, no internal hemorrhoid, no fissure, no mass, no tenderness and anal tone normal. Prostate is not enlarged (20gm) and not tender.  Musculoskeletal: Normal range of motion. He exhibits no edema.  Lymphadenopathy:    He has no cervical adenopathy.  Neurological: He is alert and oriented to person, place, and time.  CN grossly intact, station and gait intact  Skin: Skin is warm and dry. No rash noted.  Psychiatric: He has a normal mood and affect. His behavior is normal. Judgment and thought content normal.  Nursing note and vitals reviewed.  Results for orders placed or performed in visit on 10/01/15  Lipid panel  Result Value Ref Range   Cholesterol 170 0 - 200 mg/dL   Triglycerides 16.1 0.0 - 149.0 mg/dL   HDL 09.60 >45.40 mg/dL   VLDL 98.1 0.0 - 19.1 mg/dL   LDL Cholesterol 478 (H) 0 - 99 mg/dL   Total CHOL/HDL Ratio 4    NonHDL 126.34   Basic metabolic panel  Result Value Ref Range   Sodium 142 135 - 145 mEq/L   Potassium 4.3 3.5 - 5.1 mEq/L   Chloride 108 96 - 112 mEq/L   CO2 29 19 - 32 mEq/L   Glucose, Bld 94 70 - 99 mg/dL   BUN 16 6 - 23 mg/dL   Creatinine, Ser 2.95 0.40 - 1.50 mg/dL   Calcium 9.3 8.4 - 62.1 mg/dL   GFR 30.86 >57.84 mL/min      Assessment & Plan:   Problem List Items Addressed This Visit    Healthcare maintenance - Primary    Preventative protocols reviewed and updated unless pt declined. Discussed healthy diet and lifestyle.       Onychomycosis    lamisil refilled. With RFx1. If needed, I asked pt to  return for rpt LFTs between courses.      Relevant Medications   terbinafine (LAMISIL) 250 MG tablet   Smoker    Continue to encourage cessation. Down to E-cig use only.       Other Visit Diagnoses    Special screening for malignant neoplasms, colon       Relevant Orders   Ambulatory referral to Gastroenterology   Special screening for malignant neoplasm of prostate       Relevant Orders   PSA   Hyperlipidemia, unspecified hyperlipidemia type       Relevant Orders   Lipid panel   Comprehensive metabolic panel       Follow up plan: Return in about 1 year (around 01/05/2019) for annual exam, prior fasting for blood work.  [name], MD

## 2018-01-05 NOTE — Patient Instructions (Addendum)
We will refer you for colonoscopy. Flu shot today Labs today.  lamisil x 6 wks with one refill - if 2nd course needed, let us know for liver function check (labwork).   Health Maintenance, Male A healthy lifestyle and preventive care is important for your health and wellness. Ask your health care provider about what schedule of regular examinations is right for you. What should I know about weight and diet? Eat a Healthy Diet  Eat plenty of vegetables, fruits, whole grains, low-fat dairy products, and lean protein.  Do not eat a lot of foods high in solid fats, added sugars, or salt.  Maintain a Healthy Weight Regular exercise can help you achieve or maintain a healthy weight. You should:  Do at least 150 minutes of exercise each week. The exercise should increase your heart rate and make you sweat (moderate-intensity exercise).  Do strength-training exercises at least twice a week.  Watch Your Levels of Cholesterol and Blood Lipids  Have your blood tested for lipids and cholesterol every 5 years starting at 51 years of age. If you are at high risk for heart disease, you should start having your blood tested when you are 51 years old. You may need to have your cholesterol levels checked more often if: ? Your lipid or cholesterol levels are high. ? You are older than 51 years of age. ? You are at high risk for heart disease.  What should I know about cancer screening? Many types of cancers can be detected early and may often be prevented. Lung Cancer  You should be screened every year for lung cancer if: ? You are a current smoker who has smoked for at least 30 years. ? You are a former smoker who has quit within the past 15 years.  Talk to your health care provider about your screening options, when you should start screening, and how often you should be screened.  Colorectal Cancer  Routine colorectal cancer screening usually begins at 51 years of age and should be repeated  every 5-10 years until you are 51 years old. You may need to be screened more often if early forms of precancerous polyps or small growths are found. Your health care provider may recommend screening at an earlier age if you have risk factors for colon cancer.  Your health care provider may recommend using home test kits to check for hidden blood in the stool.  A small camera at the end of a tube can be used to examine your colon (sigmoidoscopy or colonoscopy). This checks for the earliest forms of colorectal cancer.  Prostate and Testicular Cancer  Depending on your age and overall health, your health care provider may do certain tests to screen for prostate and testicular cancer.  Talk to your health care provider about any symptoms or concerns you have about testicular or prostate cancer.  Skin Cancer  Check your skin from head to toe regularly.  Tell your health care provider about any new moles or changes in moles, especially if: ? There is a change in a mole's size, shape, or color. ? You have a mole that is larger than a pencil eraser.  Always use sunscreen. Apply sunscreen liberally and repeat throughout the day.  Protect yourself by wearing long sleeves, pants, a wide-brimmed hat, and sunglasses when outside.  What should I know about heart disease, diabetes, and high blood pressure?  If you are 8218-51 years of age, have your blood pressure checked every 3-5 years. If  If you are 40 years of age or older, have your blood pressure checked every year. You should have your blood pressure measured twice-once when you are at a hospital or clinic, and once when you are not at a hospital or clinic. Record the average of the two measurements. To check your blood pressure when you are not at a hospital or clinic, you can use: ? An automated blood pressure machine at a pharmacy. ? A home blood pressure monitor.  Talk to your health care provider about your target blood pressure.  If you are  between 45-79 years old, ask your health care provider if you should take aspirin to prevent heart disease.  Have regular diabetes screenings by checking your fasting blood sugar level. ? If you are at a normal weight and have a low risk for diabetes, have this test once every three years after the age of 45. ? If you are overweight and have a high risk for diabetes, consider being tested at a younger age or more often.  A one-time screening for abdominal aortic aneurysm (AAA) by ultrasound is recommended for men aged 65-75 years who are current or former smokers. What should I know about preventing infection? Hepatitis B If you have a higher risk for hepatitis B, you should be screened for this virus. Talk with your health care provider to find out if you are at risk for hepatitis B infection. Hepatitis C Blood testing is recommended for:  Everyone born from 1945 through 1965.  Anyone with known risk factors for hepatitis C.  Sexually Transmitted Diseases (STDs)  You should be screened each year for STDs including gonorrhea and chlamydia if: ? You are sexually active and are younger than 51 years of age. ? You are older than 51 years of age and your health care provider tells you that you are at risk for this type of infection. ? Your sexual activity has changed since you were last screened and you are at an increased risk for chlamydia or gonorrhea. Ask your health care provider if you are at risk.  Talk with your health care provider about whether you are at high risk of being infected with HIV. Your health care provider may recommend a prescription medicine to help prevent HIV infection.  What else can I do?  Schedule regular health, dental, and eye exams.  Stay current with your vaccines (immunizations).  Do not use any tobacco products, such as cigarettes, chewing tobacco, and e-cigarettes. If you need help quitting, ask your health care provider.  Limit alcohol intake to no  more than 2 drinks per day. One drink equals 12 ounces of beer, 5 ounces of wine, or 1 ounces of hard liquor.  Do not use street drugs.  Do not share needles.  Ask your health care provider for help if you need support or information about quitting drugs.  Tell your health care provider if you often feel depressed.  Tell your health care provider if you have ever been abused or do not feel safe at home. This information is not intended to replace advice given to you by your health care provider. Make sure you discuss any questions you have with your health care provider. Document Released: 05/22/2008 Document Revised: 07/23/2016 Document Reviewed: 08/28/2015 Elsevier Interactive Patient Education  2018 Elsevier Inc.  

## 2018-01-05 NOTE — Assessment & Plan Note (Signed)
Continue to encourage cessation. Down to E-cig use only.

## 2018-01-05 NOTE — Assessment & Plan Note (Signed)
Preventative protocols reviewed and updated unless pt declined. Discussed healthy diet and lifestyle.  

## 2018-01-05 NOTE — Assessment & Plan Note (Signed)
lamisil refilled. With RFx1. If needed, I asked pt to return for rpt LFTs between courses.

## 2018-01-08 HISTORY — PX: ORIF WRIST FRACTURE: SHX2133

## 2018-01-09 DIAGNOSIS — S52352A Displaced comminuted fracture of shaft of radius, left arm, initial encounter for closed fracture: Secondary | ICD-10-CM | POA: Diagnosis not present

## 2018-01-09 DIAGNOSIS — S6992XA Unspecified injury of left wrist, hand and finger(s), initial encounter: Secondary | ICD-10-CM | POA: Diagnosis not present

## 2018-01-09 DIAGNOSIS — W2102XA Struck by soccer ball, initial encounter: Secondary | ICD-10-CM | POA: Diagnosis not present

## 2018-01-09 DIAGNOSIS — Y92322 Soccer field as the place of occurrence of the external cause: Secondary | ICD-10-CM | POA: Insufficient documentation

## 2018-01-09 DIAGNOSIS — F17290 Nicotine dependence, other tobacco product, uncomplicated: Secondary | ICD-10-CM | POA: Insufficient documentation

## 2018-01-09 DIAGNOSIS — S52502A Unspecified fracture of the lower end of left radius, initial encounter for closed fracture: Secondary | ICD-10-CM | POA: Diagnosis not present

## 2018-01-09 DIAGNOSIS — S52592A Other fractures of lower end of left radius, initial encounter for closed fracture: Secondary | ICD-10-CM | POA: Diagnosis not present

## 2018-01-09 DIAGNOSIS — Y998 Other external cause status: Secondary | ICD-10-CM | POA: Insufficient documentation

## 2018-01-09 DIAGNOSIS — Y9366 Activity, soccer: Secondary | ICD-10-CM | POA: Insufficient documentation

## 2018-01-10 ENCOUNTER — Emergency Department (HOSPITAL_COMMUNITY)
Admission: EM | Admit: 2018-01-10 | Discharge: 2018-01-10 | Disposition: A | Discharge status: Home / Self Care | Payer: BLUE CROSS/BLUE SHIELD | Attending: Emergency Medicine | Admitting: Emergency Medicine

## 2018-01-10 ENCOUNTER — Encounter (HOSPITAL_COMMUNITY): Payer: Self-pay | Admitting: Emergency Medicine

## 2018-01-10 ENCOUNTER — Emergency Department (HOSPITAL_COMMUNITY): Payer: BLUE CROSS/BLUE SHIELD

## 2018-01-10 DIAGNOSIS — S52502A Unspecified fracture of the lower end of left radius, initial encounter for closed fracture: Secondary | ICD-10-CM

## 2018-01-10 DIAGNOSIS — S52592A Other fractures of lower end of left radius, initial encounter for closed fracture: Secondary | ICD-10-CM | POA: Diagnosis not present

## 2018-01-10 MED ORDER — OXYCODONE-ACETAMINOPHEN 5-325 MG PO TABS: 1.0000 | ORAL_TABLET | Freq: Four times a day (QID) | ORAL | 0 refills | Status: DC | PRN

## 2018-01-10 MED ORDER — FENTANYL CITRATE (PF) 100 MCG/2ML IJ SOLN
100.0000 ug | Freq: Once | INTRAMUSCULAR | Status: AC
  Administered 2018-01-10: 100 ug via INTRAVENOUS
  Filled 2018-01-10: qty 2

## 2018-01-10 MED ORDER — FENTANYL CITRATE (PF) 100 MCG/2ML IJ SOLN
50.0000 ug | Freq: Once | INTRAMUSCULAR | Status: AC
  Administered 2018-01-10: 50 ug via INTRAVENOUS
  Filled 2018-01-10: qty 2

## 2018-01-10 MED ORDER — KETOROLAC TROMETHAMINE 15 MG/ML IJ SOLN
30.0000 mg | Freq: Once | INTRAMUSCULAR | Status: AC
  Administered 2018-01-10: 30 mg via INTRAVENOUS
  Filled 2018-01-10: qty 2

## 2018-01-10 NOTE — ED Provider Notes (Signed)
[facility] EMERGENCY DEPARTMENT Provider Note   CSN: [id number] Arrival date & time: 01/09/18  2352     History   Chief Complaint Chief Complaint  Patient presents with  . Wrist Injury    HPI Derrick Young is a 52 y.o. male.  The history is provided by the patient.  Wrist Pain  This is a new problem. The current episode started 1 to 2 hours ago. The problem occurs constantly. The problem has not changed since onset.Pertinent negatives include no chest pain, no abdominal pain, no headaches and no shortness of breath. Nothing aggravates the symptoms. Nothing relieves the symptoms. He has tried nothing for the symptoms. The treatment provided no relief.  pLAYING SOCCER AT 1125 PM and soccer ball hit the volar surface of the wrist.  Felt immediate pain.    Past Medical History:  Diagnosis Date  . History of chicken pox   . History of nephrolithiasis 2012  . Smoker     Patient Active Problem List   Diagnosis Date Noted  . Onychomycosis 01/07/2017  . Healthcare maintenance 03/31/2013  . Smoker   . History of nephrolithiasis     Past Surgical History:  Procedure Laterality Date  . LITHOTRIPSY  03/2013       Home Medications    Prior to Admission medications   Medication Sig Start Date End Date Taking? Authorizing Provider  ibuprofen (ADVIL,MOTRIN) 200 MG tablet Take 200 mg by mouth every 6 (six) hours as needed.    [provider]  oxyCODONE-acetaminophen (PERCOCET) 5-325 MG tablet Take 1 tablet by mouth every 6 (six) hours as needed. 01/10/18   Daionna Crossland, MD  terbinafine (LAMISIL) 250 MG tablet Take 1 tablet (250 mg total) by mouth daily. 01/05/18   [name], MD    Family History Family History  Problem Relation Age of Onset  . Cancer Paternal Uncle 75       lung (smoker)  . Cancer Paternal Uncle 55       throat  . Emphysema Father        smoker  . Urolithiasis Mother   . CAD Neg Hx   . Stroke Neg Hx   . Diabetes Neg  Hx   . Hypertension Neg Hx     Social History Social History   Tobacco Use  . Smoking status: Current Some Day Smoker    Types: E-cigarettes    Start date: 12/09/1983  . Smokeless tobacco: Never Used  . Tobacco comment: quit smoking cigarettes/cigars 11/2017  Substance Use Topics  . Alcohol use: No  . Drug use: No     Allergies   Patient has no known allergies.   Review of Systems Review of Systems  Eyes: Negative for photophobia.  Respiratory: Negative for shortness of breath.   Cardiovascular: Negative for chest pain.  Gastrointestinal: Negative for abdominal pain.  Genitourinary: Negative for hematuria.  Musculoskeletal: Positive for arthralgias. Negative for joint swelling.  Neurological: Negative for headaches.  All other systems reviewed and are negative.    Physical Exam Updated Vital Signs BP 100/68   Pulse 62   Temp 97.8 F (36.6 C) (Oral)   Resp (!) 22   Ht 5\' 9"  (1.753 m)   Wt 70.8 kg (156 lb)   SpO2 95%   BMI 23.04 kg/m   Physical Exam  Constitutional: He is oriented to person, place, and time. He appears well-developed and well-nourished. No distress.  HENT:  Head: Normocephalic and atraumatic.  Mouth/Throat: No oropharyngeal  exudate.  Eyes: Conjunctivae are normal. Pupils are equal, round, and reactive to light.  Neck: Normal range of motion. Neck supple.  Cardiovascular: Normal rate, regular rhythm, normal heart sounds and intact distal pulses.  Pulmonary/Chest: Effort normal and breath sounds normal. No stridor. He has no wheezes. He has no rales.  Abdominal: Soft. Bowel sounds are normal. He exhibits no mass. There is no tenderness. There is no rebound and no guarding.  Musculoskeletal: Normal range of motion. He exhibits deformity.       Left wrist: He exhibits tenderness, bony tenderness and deformity. He exhibits normal range of motion, no swelling, no effusion, no crepitus and no laceration.       Left hand: Normal. He exhibits normal  capillary refill. Normal sensation noted. Decreased sensation is not present in the ulnar distribution, is not present in the medial redistribution and is not present in the radial distribution. Normal strength noted.  Neurological: He is alert and oriented to person, place, and time. He displays normal reflexes.  Skin: Skin is warm and dry. Capillary refill takes less than 2 seconds.  Psychiatric: He has a normal mood and affect.     ED Treatments / Results   Vitals:   01/10/18 0315 01/10/18 0319  BP: (!) 99/56 (!) 98/52  Pulse: 69 68  Resp:  15  Temp:    SpO2: 96% 95%    Radiology Dg Wrist Complete Left  Result Date: 01/10/2018 CLINICAL DATA:  Left wrist pain after soccer injury tonight. Deformity. EXAM: LEFT WRIST - COMPLETE 3+ VIEW; LEFT HAND - COMPLETE 3+ VIEW COMPARISON:  None. FINDINGS: Three views of the left hand and four views of the left wrist are obtained. Comminuted fractures of the distal left radial metaphysis with mild dorsal displacement and slight dorsal angulation of the distal fracture fragments. Mild impaction. Fracture lines extend to the radioulnar joint without extension demonstrated to the radiocarpal joint. The ulnar styloid process appears intact. Carpal bones, metacarpals, and phalanges appear intact. Soft tissue swelling over the wrist. No focal bone lesion or bone destruction. No radiopaque soft tissue foreign bodies. IMPRESSION: Comminuted fractures of the distal left radial metaphysis with mild dorsal displacement and angulation of the distal fracture fragments. Fracture lines extend to the radioulnar joint without extension to the radiocarpal joint. The ulnar styloid process appears intact. Electronically Signed   By: Burman Nieves M.D.   On: 01/10/2018 01:04   Dg Hand Complete Left  Result Date: 01/10/2018 CLINICAL DATA:  Left wrist pain after soccer injury tonight. Deformity. EXAM: LEFT WRIST - COMPLETE 3+ VIEW; LEFT HAND - COMPLETE 3+ VIEW COMPARISON:   None. FINDINGS: Three views of the left hand and four views of the left wrist are obtained. Comminuted fractures of the distal left radial metaphysis with mild dorsal displacement and slight dorsal angulation of the distal fracture fragments. Mild impaction. Fracture lines extend to the radioulnar joint without extension demonstrated to the radiocarpal joint. The ulnar styloid process appears intact. Carpal bones, metacarpals, and phalanges appear intact. Soft tissue swelling over the wrist. No focal bone lesion or bone destruction. No radiopaque soft tissue foreign bodies. IMPRESSION: Comminuted fractures of the distal left radial metaphysis with mild dorsal displacement and angulation of the distal fracture fragments. Fracture lines extend to the radioulnar joint without extension to the radiocarpal joint. The ulnar styloid process appears intact. Electronically Signed   By: Burman Nieves M.D.   On: 01/10/2018 01:04    Procedures Procedures (including critical care time)  Medications Ordered in ED Medications  fentaNYL (SUBLIMAZE) injection 50 mcg (50 mcg Intravenous Given 01/10/18 0017)  ketorolac (TORADOL) 15 MG/ML injection 30 mg (30 mg Intravenous Given 01/10/18 0017)  fentaNYL (SUBLIMAZE) injection 100 mcg (100 mcg Intravenous Given 01/10/18 0215)     Gentle traction on wrist with ortho tech in order to apply splint and better align wrist.     Final Clinical Impressions(s) / ED Diagnoses   Final diagnoses:  Closed fracture of distal end of left radius, unspecified fracture morphology, initial encounter  Follow up with hand surgery, call Monday morning to schedule a follow up appointment.  No driving while taking narcotic pain medication.  Ice for 20 minutes every 2 hours.   Return for weakness, numbness, changes in vision or speech,  fevers > 100.4 unrelieved by medication, shortness of breath, intractable vomiting, or diarrhea, abdominal pain, Inability to tolerate liquids or food, cough,  altered mental status or any concerns. No signs of systemic illness or infection. The patient is nontoxic-appearing on exam and vital signs are within normal limits.    I have reviewed the triage vital signs and the nursing notes. Pertinent labs &imaging results that were available during my care of the patient were reviewed by me and considered in my medical decision making (see chart for details).  After history, exam, and medical workup I feel the patient has been appropriately medically screened and is safe for discharge home. Pertinent diagnoses were discussed with the patient. Patient was given return precautions.    ED Discharge Orders        Ordered    oxyCODONE-acetaminophen (PERCOCET) 5-325 MG tablet  Every 6 hours PRN     01/10/18 0312       Farin Buhman, MD 01/10/18 [id number]

## 2018-01-10 NOTE — ED Triage Notes (Signed)
Pt was playing soccer when he went to block the ball and his wrist came in contact with ball and foot. Deformity noted

## 2018-01-10 NOTE — ED Notes (Signed)
Pt and family understood dc material. NAD noted. Script given at [facility]. Follow up discussed in detail and patient confrimed

## 2018-01-11 DIAGNOSIS — S52572A Other intraarticular fracture of lower end of left radius, initial encounter for closed fracture: Secondary | ICD-10-CM | POA: Diagnosis not present

## 2018-01-13 DIAGNOSIS — G8918 Other acute postprocedural pain: Secondary | ICD-10-CM | POA: Diagnosis not present

## 2018-01-13 DIAGNOSIS — Y999 Unspecified external cause status: Secondary | ICD-10-CM | POA: Diagnosis not present

## 2018-01-13 DIAGNOSIS — X58XXXA Exposure to other specified factors, initial encounter: Secondary | ICD-10-CM | POA: Diagnosis not present

## 2018-01-13 DIAGNOSIS — S52572A Other intraarticular fracture of lower end of left radius, initial encounter for closed fracture: Secondary | ICD-10-CM | POA: Diagnosis not present

## 2018-01-18 DIAGNOSIS — M25632 Stiffness of left wrist, not elsewhere classified: Secondary | ICD-10-CM | POA: Diagnosis not present

## 2018-01-18 DIAGNOSIS — M25532 Pain in left wrist: Secondary | ICD-10-CM | POA: Diagnosis not present

## 2018-01-18 DIAGNOSIS — S52572A Other intraarticular fracture of lower end of left radius, initial encounter for closed fracture: Secondary | ICD-10-CM | POA: Diagnosis not present

## 2018-02-11 DIAGNOSIS — S52572A Other intraarticular fracture of lower end of left radius, initial encounter for closed fracture: Secondary | ICD-10-CM | POA: Diagnosis not present

## 2018-03-03 DIAGNOSIS — Z01818 Encounter for other preprocedural examination: Secondary | ICD-10-CM | POA: Diagnosis not present

## 2018-03-03 DIAGNOSIS — Z1211 Encounter for screening for malignant neoplasm of colon: Secondary | ICD-10-CM | POA: Diagnosis not present

## 2018-03-04 DIAGNOSIS — S52572A Other intraarticular fracture of lower end of left radius, initial encounter for closed fracture: Secondary | ICD-10-CM | POA: Diagnosis not present

## 2018-03-25 DIAGNOSIS — S52572A Other intraarticular fracture of lower end of left radius, initial encounter for closed fracture: Secondary | ICD-10-CM | POA: Diagnosis not present

## 2018-04-09 DIAGNOSIS — Z23 Encounter for immunization: Secondary | ICD-10-CM | POA: Diagnosis not present

## 2018-04-20 ENCOUNTER — Ambulatory Visit: Payer: BLUE CROSS/BLUE SHIELD | Admitting: Family Medicine

## 2018-05-25 DIAGNOSIS — F419 Anxiety disorder, unspecified: Secondary | ICD-10-CM | POA: Diagnosis not present

## 2018-05-30 DIAGNOSIS — F419 Anxiety disorder, unspecified: Secondary | ICD-10-CM | POA: Diagnosis not present

## 2018-06-01 DIAGNOSIS — F419 Anxiety disorder, unspecified: Secondary | ICD-10-CM | POA: Diagnosis not present

## 2018-06-14 DIAGNOSIS — Z113 Encounter for screening for infections with a predominantly sexual mode of transmission: Secondary | ICD-10-CM | POA: Diagnosis not present

## 2018-06-14 DIAGNOSIS — R369 Urethral discharge, unspecified: Secondary | ICD-10-CM | POA: Diagnosis not present

## 2018-06-16 ENCOUNTER — Ambulatory Visit: Payer: BLUE CROSS/BLUE SHIELD | Admitting: Family Medicine

## 2018-06-16 ENCOUNTER — Encounter: Payer: Self-pay | Admitting: Family Medicine

## 2018-06-16 VITALS — BP 100/60 | HR 76 | Temp 98.3°F | Ht 69.0 in | Wt 155.2 lb

## 2018-06-16 DIAGNOSIS — B360 Pityriasis versicolor: Secondary | ICD-10-CM | POA: Diagnosis not present

## 2018-06-16 DIAGNOSIS — Z7189 Other specified counseling: Secondary | ICD-10-CM

## 2018-06-16 DIAGNOSIS — R369 Urethral discharge, unspecified: Secondary | ICD-10-CM | POA: Diagnosis not present

## 2018-06-16 DIAGNOSIS — Z7184 Encounter for health counseling related to travel: Secondary | ICD-10-CM

## 2018-06-16 MED ORDER — TYPHOID VACCINE PO CPDR: 1.0000 | DELAYED_RELEASE_CAPSULE | ORAL | 0 refills | Status: DC

## 2018-06-16 NOTE — Patient Instructions (Addendum)
Return in 3-4 weeks for lab visit only to check urine test.  Check in a week with urgent care to see if any urine culture was done.  Let us know if any recurrent symptoms.  I recommend recheck HIV screen at next physical.  Bring [place] copy of any immunization records available Check with insurance if measles (MMR) booster would be covered  (Hep A, B, measles, typhoid, and meningitis).  Typhoid vaccine (vivotif) printed out - start 2 weeks prior to trip.

## 2018-06-16 NOTE — Progress Notes (Signed)
BP 100/60   Pulse 76   Temp 98.3 F (36.8 C) (Oral)   Ht '5\' 9"'  (1.753 m)   Wt 155 lb 4 oz (70.4 kg)   BMI 22.93 kg/m    CC: dysuria Subjective:    Patient ID: Derrick Young, male    DOB: 05/18/1966, 52 y.o.   MRN: [id number]  HPI: Derrick Young is a 52 y.o. male presenting on 06/16/2018 for burninw ith urination (symptoms started over a week ago)    1 wk h/o burning sensation with urination. Started taking son's left over keflex. Also noticed white urethral discharge, last noted 1 wk ago. Concerned about STD - 1 extramarital sexual partner recently. Used protection. Ongoing marital strain.   Denies urgency, frequency, hematuria, abd pain, nausea/vomiting, flank pain, fevers/chills, swollen glands.   Saw [facility] Sunday. UA was normal.  Treated with antibiotic shot and oral antibiotic (likely rocephin and azithromycin).  Had blood and urine tests - including chlamydia and gonorrhea - states all testing returned negative.  [facility] doc found possible L sided inguinal hernia.   Upcoming trip to [place]. Asks about vaccine recommendations.  Unsure immunization status   Ongoing skin rash throughout back. Creams, antifungal shampoo has not been effective - always recurs. Had antifungal spray which was effective, doesn't remember what this was.  Stopped lamisil 2 months ago.   Relevant past medical, surgical, family and social history reviewed and updated as indicated. Interim medical history since our last visit reviewed. Allergies and medications reviewed and updated. Outpatient Medications Prior to Visit  Medication Sig Dispense Refill  . ibuprofen (ADVIL,MOTRIN) 200 MG tablet Take 200 mg by mouth every 6 (six) hours as needed.    Marland Kitchen oxyCODONE-acetaminophen (PERCOCET) 5-325 MG tablet Take 1 tablet by mouth every 6 (six) hours as needed. (Patient not taking: Reported on 06/16/2018) 11 tablet 0  . terbinafine (LAMISIL) 250 MG tablet Take 1 tablet (250 mg total) by mouth daily.  (Patient not taking: Reported on 06/16/2018) 45 tablet 1   No facility-administered medications prior to visit.      Per HPI unless specifically indicated in ROS section below Review of Systems     Objective:    BP 100/60   Pulse 76   Temp 98.3 F (36.8 C) (Oral)   Ht '5\' 9"'  (1.753 m)   Wt 155 lb 4 oz (70.4 kg)   BMI 22.93 kg/m   Wt Readings from Last 3 Encounters:  06/16/18 155 lb 4 oz (70.4 kg)  01/09/18 156 lb (70.8 kg)  01/05/18 156 lb 8 oz (71 kg)    Physical Exam  Constitutional: He appears well-developed and well-nourished. No distress.  Abdominal: Hernia confirmed negative in the right inguinal area (laxity of inguinal region with cough) and confirmed negative in the left inguinal area.  Genitourinary: Testes normal and penis normal. Right testis shows no mass, no swelling and no tenderness. Right testis is descended. Left testis shows no mass, no swelling and no tenderness. Left testis is descended. No phimosis or penile erythema. No discharge found.  Musculoskeletal: He exhibits no edema.  Lymphadenopathy: No inguinal adenopathy noted on the right or left side.  Skin: Skin is warm and dry. Rash noted. Rash is macular.  Hyperpigmented macular rash throughout back and upper chest  Psychiatric: He has a normal mood and affect.  Nursing note and vitals reviewed.     Assessment & Plan:   Problem List Items Addressed This Visit    Urethral discharge in male -  Primary    After new sexual contact concern for STD, s/p appropriate treatment at [facility]. He did have testing done after he had started keflex course.  I did ask patient to return in 3-4 wks for retesting for chlamydia/gonorrhea.  Encouraged open communication and honesty with wife regarding above.       Relevant Orders   C. trachomatis/N. gonorrhoeae RNA   Travel advice encounter    Upcoming trip to [place]. Reviewed CDC immunization recommendations: Hep A, B, measles, typhoid, and meningitis Pt will check  at home for immunization records (Hep A/B). He did have meningitis vaccine earlier this year.  He would be due for measles booster. I asked him to check with insurance about MMR coverage.  Will Rx oral typhoid vaccine. Discussed common side effects.       Tinea versicolor    Clinically consistent with this. Not controlled with topical therapy. Will Rx diflucan - to start when he returns from upcoming travels.           Meds ordered this encounter  Medications  . typhoid (VIVOTIF) DR capsule    Sig: Take 1 capsule by mouth every other day.    Dispense:  4 capsule    Refill:  0  . fluconazole (DIFLUCAN) 150 MG tablet    Sig: Take 2 tablets (300 mg total) by mouth once a week. For 2 weeks    Dispense:  4 tablet    Refill:  0   Orders Placed This Encounter  Procedures  . C. trachomatis/N. gonorrhoeae RNA    Standing Status:   Future    Standing Expiration Date:   06/18/2019    Follow up plan: No follow-ups on file.  Ria Bush, MD

## 2018-06-17 ENCOUNTER — Encounter: Payer: Self-pay | Admitting: Family Medicine

## 2018-06-17 DIAGNOSIS — Z7184 Encounter for health counseling related to travel: Secondary | ICD-10-CM | POA: Insufficient documentation

## 2018-06-17 DIAGNOSIS — B360 Pityriasis versicolor: Secondary | ICD-10-CM | POA: Insufficient documentation

## 2018-06-17 DIAGNOSIS — R369 Urethral discharge, unspecified: Secondary | ICD-10-CM | POA: Insufficient documentation

## 2018-06-17 MED ORDER — FLUCONAZOLE 150 MG PO TABS: 300.0000 mg | ORAL_TABLET | ORAL | 0 refills | Status: DC

## 2018-06-17 NOTE — Assessment & Plan Note (Addendum)
After new sexual contact concern for STD, [facility]. He did have testing done after he had started keflex course.  I did ask patient to return in 3-4 wks for retesting for chlamydia/gonorrhea.  Encouraged open communication and honesty with wife regarding above.

## 2018-06-17 NOTE — Assessment & Plan Note (Signed)
Upcoming trip to [place]. Reviewed CDC immunization recommendations: Hep A, B, measles, typhoid, and meningitis Pt will check at home for immunization records (Hep A/B). He did have meningitis vaccine earlier this year.  He would be due for measles booster. I asked him to check with insurance about MMR coverage.  Will Rx oral typhoid vaccine. Discussed common side effects.

## 2018-06-17 NOTE — Assessment & Plan Note (Signed)
Clinically consistent with this. Not controlled with topical therapy. Will Rx diflucan - to start when he returns from upcoming travels.

## 2018-06-18 DIAGNOSIS — L299 Pruritus, unspecified: Secondary | ICD-10-CM | POA: Diagnosis not present

## 2018-06-20 DIAGNOSIS — F419 Anxiety disorder, unspecified: Secondary | ICD-10-CM | POA: Diagnosis not present

## 2018-07-05 ENCOUNTER — Encounter: Payer: Self-pay | Admitting: Family Medicine

## 2018-07-05 ENCOUNTER — Ambulatory Visit: Payer: BLUE CROSS/BLUE SHIELD | Admitting: Family Medicine

## 2018-07-05 VITALS — BP 118/62 | HR 60 | Temp 97.9°F | Ht 69.0 in | Wt 153.5 lb

## 2018-07-05 DIAGNOSIS — B009 Herpesviral infection, unspecified: Secondary | ICD-10-CM

## 2018-07-05 DIAGNOSIS — R300 Dysuria: Secondary | ICD-10-CM | POA: Diagnosis not present

## 2018-07-05 DIAGNOSIS — Z113 Encounter for screening for infections with a predominantly sexual mode of transmission: Secondary | ICD-10-CM

## 2018-07-05 DIAGNOSIS — S161XXA Strain of muscle, fascia and tendon at neck level, initial encounter: Secondary | ICD-10-CM

## 2018-07-05 DIAGNOSIS — Z7184 Encounter for health counseling related to travel: Secondary | ICD-10-CM

## 2018-07-05 DIAGNOSIS — Z7189 Other specified counseling: Secondary | ICD-10-CM

## 2018-07-05 LAB — POC URINALSYSI DIPSTICK (AUTOMATED)
Bilirubin, UA: NEGATIVE
Glucose, UA: NEGATIVE
Leukocytes, UA: NEGATIVE
Nitrite, UA: NEGATIVE
PH UA: 6 (ref 5.0–8.0)
PROTEIN UA: NEGATIVE
RBC UA: NEGATIVE
UROBILINOGEN UA: 0.2 U/dL

## 2018-07-05 MED ORDER — CIPROFLOXACIN HCL 500 MG PO TABS: 500.0000 mg | ORAL_TABLET | Freq: Two times a day (BID) | ORAL | 0 refills | Status: DC

## 2018-07-05 NOTE — Addendum Note (Signed)
Addended by: Wendi MayaGREESON, Abbigayle Toole on: 07/05/2018 09:02 AM   Modules accepted: Orders

## 2018-07-05 NOTE — Progress Notes (Addendum)
BP 118/62 (BP Location: Left Arm, Patient Position: Sitting, Cuff Size: Normal)   Pulse 60   Temp 97.9 F (36.6 C) (Oral)   Ht 5\' 9"  (1.753 m)   Wt 153 lb 8 oz (69.6 kg)   SpO2 97%   BMI 22.67 kg/m    CC: f/u visit Subjective:    Patient ID: Derrick Young, male    DOB: 1966-04-01, 52 y.o.   MRN: [id number]  HPI: Derrick Young is a 52 y.o. male presenting on 07/05/2018 for Follow-up   See prior note for details.  Upcoming trip to [place].  Here for rpt CT/GC tests. Mild dysuria. 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  He would like testing for all STDs again.   Tinea versicolor - last visit we provided Rx for diflucan. Has not started this medication yet.   R shoulder pain over the last 10 days. 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 Has improved with playing tennis. Stretching. Has taken motrin for this. No ice/heating pad. No neck pain. No shooting pain down arm, numbness, weakness of arm.   Relevant past medical, surgical, family and social history reviewed and updated as indicated. Interim medical history since our last visit reviewed. Allergies and medications reviewed and updated. Outpatient Medications Prior to Visit  Medication Sig Dispense Refill  . ibuprofen (ADVIL,MOTRIN) 200 MG tablet Take 200 mg by mouth every 6 (six) hours as needed.    . fluconazole (DIFLUCAN) 150 MG tablet Take 2 tablets (300 mg total) by mouth once a week. For 2 weeks (Patient not taking: Reported on 07/05/2018) 4 tablet 0  . typhoid (VIVOTIF) DR capsule Take 1 capsule by mouth every other day. 4 capsule 0   No facility-administered medications prior to visit.      Per HPI unless specifically indicated in ROS section below Review of Systems     Objective:    BP 118/62 (BP Location: Left Arm, Patient Position: Sitting, Cuff Size: Normal)   Pulse 60   Temp 97.9 F (36.6 C) (Oral)   Ht 5\' 9"  (1.753 m)   Wt 153 lb 8 oz (69.6 kg)   SpO2 97%   BMI 22.67 kg/m     Wt Readings from Last 3 Encounters:  07/05/18 153 lb 8 oz (69.6 kg)  06/16/18 155 lb 4 oz (70.4 kg)  01/09/18 156 lb (70.8 kg)    Physical Exam  Constitutional: He appears well-developed and well-nourished. No distress.  HENT:  Head: Normocephalic and atraumatic.  Neck: Normal range of motion. Neck supple. Tracheal deviation present.  Musculoskeletal: Normal range of motion.  FROM at neck No pain midline or paracervical mm  No pain at trapezius mm FROM at shoulders without pain withoout pain on rotation of humeral head in [facility] joint.  No impingement.  Lymphadenopathy:    He has cervical adenopathy.  Nursing note and vitals reviewed.     Assessment & Plan:   Problem List Items Addressed This Visit    Travel advice encounter    Pt would like abx rx for traveler's diarrhea - sent in cipro to pharmacy.       Neck strain, initial encounter - Primary    Anticipate R trap sprain. Supportive care reviewed - NSAIDs, heating pad, gentle stretching. Update if not improving with treatment.      HSV-1 infection    Recent dx at [facility] Discussed this is more prevalent type of HSV.   Would like repeat testing. Ordered today.  Relevant Orders   HSV Type I/II IgG, IgMw/ reflex   Dysuria    Endorses ongoing mild dysuria - very intermittently. Check UA today.        Other Visit Diagnoses    Screen for STD (sexually transmitted disease)       Relevant Orders   HIV antibody   RPR   C. trachomatis/N. gonorrhoeae RNA   Hepatitis panel, acute   HSV Type I/II IgG, IgMw/ reflex       Meds ordered this encounter  Medications  . ciprofloxacin (CIPRO) 500 MG tablet    Sig: Take 1 tablet (500 mg total) by mouth 2 (two) times daily. For traveler's diarrhea    Dispense:  6 tablet    Refill:  0   Orders Placed This Encounter  Procedures  . C. trachomatis/N. gonorrhoeae RNA    Standing Status:   Future    Standing Expiration Date:   07/06/2019  . HIV antibody  . RPR  . Hepatitis  panel, acute  . HSV Type I/II IgG, IgMw/ reflex    Follow up plan: Return if symptoms worsen or fail to improve.  Eustaquio BoydenJavier Azlyn Wingler, MD

## 2018-07-05 NOTE — Addendum Note (Signed)
Addended by: Nanci PinaGOINS, Daishia Fetterly on: 07/05/2018 08:56 AM   Modules accepted: Orders

## 2018-07-05 NOTE — Patient Instructions (Addendum)
Blood work today. Urinalysis today.  Return tomorrow for lab visit only.  I've printed out prescription for cipro floxacin for traveler's diarrhea.

## 2018-07-05 NOTE — Assessment & Plan Note (Signed)
Endorses ongoing mild dysuria - very intermittently. Check UA today.

## 2018-07-05 NOTE — Assessment & Plan Note (Signed)
Pt would like abx rx for traveler's diarrhea - sent in cipro to pharmacy.

## 2018-07-05 NOTE — Assessment & Plan Note (Signed)
Anticipate R trap sprain. Supportive care reviewed - NSAIDs, heating pad, gentle stretching. Update if not improving with treatment.

## 2018-07-05 NOTE — Assessment & Plan Note (Signed)
[facility] this is more prevalent type of HSV.   Would like repeat testing. Ordered today.

## 2018-07-06 ENCOUNTER — Ambulatory Visit: Payer: BLUE CROSS/BLUE SHIELD | Admitting: Family Medicine

## 2018-07-09 LAB — HEPATITIS PANEL, ACUTE
HEP A IGM: NONREACTIVE
HEP B C IGM: NONREACTIVE
HEP B S AG: NONREACTIVE
HEP C AB: NONREACTIVE
SIGNAL TO CUT-OFF: 0.01 (ref <1.00)

## 2018-07-09 LAB — HSV(HERPES SIMPLEX VRS) I + II AB-IGG
HAV 1 IGG,TYPE SPECIFIC AB: 27.9 index — ABNORMAL HIGH
HSV 2 IGG,TYPE SPECIFIC AB: <0.9 index

## 2018-07-09 LAB — HIV ANTIBODY (ROUTINE TESTING W REFLEX): HIV 1&2 Ab, 4th Generation: NONREACTIVE

## 2018-07-09 LAB — HSV 1/2 AB (IGM), IFA W/RFLX TITER
HSV 1 IGM SCREEN: NEGATIVE
HSV 2 IGM SCREEN: NEGATIVE

## 2018-07-09 LAB — RPR: RPR: NONREACTIVE

## 2019-03-09 HISTORY — PX: REFRACTIVE SURGERY: SHX103

## 2019-04-14 DIAGNOSIS — H527 Unspecified disorder of refraction: Secondary | ICD-10-CM | POA: Diagnosis not present

## 2019-08-09 DIAGNOSIS — Z20828 Contact with and (suspected) exposure to other viral communicable diseases: Secondary | ICD-10-CM | POA: Diagnosis not present

## 2019-08-26 ENCOUNTER — Other Ambulatory Visit: Payer: Self-pay

## 2019-08-26 ENCOUNTER — Other Ambulatory Visit: Payer: Self-pay | Admitting: Family Medicine

## 2019-08-26 ENCOUNTER — Other Ambulatory Visit (INDEPENDENT_AMBULATORY_CARE_PROVIDER_SITE_OTHER): Payer: BC Managed Care – PPO

## 2019-08-26 DIAGNOSIS — E785 Hyperlipidemia, unspecified: Secondary | ICD-10-CM

## 2019-08-26 DIAGNOSIS — Z125 Encounter for screening for malignant neoplasm of prostate: Secondary | ICD-10-CM

## 2019-08-26 LAB — COMPREHENSIVE METABOLIC PANEL
ALT: 9 U/L (ref 0–53)
AST: 12 U/L (ref 0–37)
Albumin: 4.2 g/dL (ref 3.5–5.2)
Alkaline Phosphatase: 42 U/L (ref 39–117)
BUN: 19 mg/dL (ref 6–23)
CO2: 28 mEq/L (ref 19–32)
Calcium: 9.8 mg/dL (ref 8.4–10.5)
Chloride: 106 mEq/L (ref 96–112)
Creatinine, Ser: 1 mg/dL (ref 0.40–1.50)
GFR: 78.45 mL/min (ref >60.00)
Glucose, Bld: 93 mg/dL (ref 70–99)
Potassium: 4.3 mEq/L (ref 3.5–5.1)
Sodium: 140 mEq/L (ref 135–145)
Total Bilirubin: 0.6 mg/dL (ref 0.2–1.2)
Total Protein: 6.6 g/dL (ref 6.0–8.3)

## 2019-08-26 LAB — PSA: PSA: 0.8 ng/mL (ref 0.10–4.00)

## 2019-08-26 LAB — LIPID PANEL
Cholesterol: 202 mg/dL — ABNORMAL HIGH (ref 0–200)
HDL: 47.4 mg/dL (ref >39.00)
LDL Cholesterol: 124 mg/dL — ABNORMAL HIGH (ref 0–99)
NonHDL: 154.93
Total CHOL/HDL Ratio: 4
Triglycerides: 156 mg/dL — ABNORMAL HIGH (ref 0.0–149.0)
VLDL: 31.2 mg/dL (ref 0.0–40.0)

## 2019-09-02 ENCOUNTER — Other Ambulatory Visit: Payer: Self-pay

## 2019-09-02 ENCOUNTER — Ambulatory Visit (INDEPENDENT_AMBULATORY_CARE_PROVIDER_SITE_OTHER): Payer: BC Managed Care – PPO | Admitting: Family Medicine

## 2019-09-02 ENCOUNTER — Encounter: Payer: Self-pay | Admitting: Family Medicine

## 2019-09-02 VITALS — BP 102/60 | HR 56 | Temp 97.5°F | Ht 68.5 in | Wt 145.4 lb

## 2019-09-02 DIAGNOSIS — Z1211 Encounter for screening for malignant neoplasm of colon: Secondary | ICD-10-CM | POA: Diagnosis not present

## 2019-09-02 DIAGNOSIS — Z0000 Encounter for general adult medical examination without abnormal findings: Secondary | ICD-10-CM

## 2019-09-02 DIAGNOSIS — E785 Hyperlipidemia, unspecified: Secondary | ICD-10-CM

## 2019-09-02 DIAGNOSIS — K625 Hemorrhage of anus and rectum: Secondary | ICD-10-CM | POA: Insufficient documentation

## 2019-09-02 DIAGNOSIS — M25512 Pain in left shoulder: Secondary | ICD-10-CM | POA: Insufficient documentation

## 2019-09-02 DIAGNOSIS — Z23 Encounter for immunization: Secondary | ICD-10-CM

## 2019-09-02 DIAGNOSIS — G8929 Other chronic pain: Secondary | ICD-10-CM

## 2019-09-02 DIAGNOSIS — F17200 Nicotine dependence, unspecified, uncomplicated: Secondary | ICD-10-CM

## 2019-09-02 MED ORDER — VITAMIN C 500 MG PO TABS: 500.0000 mg | ORAL_TABLET | Freq: Every day | ORAL | Status: DC

## 2019-09-02 NOTE — Progress Notes (Signed)
This visit was conducted in person.  BP 102/60   Pulse (!) 56   Temp (!) 97.5 F (36.4 C)   Ht 5' 8.5" (1.74 m)   Wt 145 lb 7 oz (66 kg)   SpO2 100%   BMI 21.79 kg/m    CC: CPE Subjective:    Patient ID: Derrick Young, male    DOB: 08/07/1966, 53 y.o.   MRN: [id number]  HPI: Derrick HopeSalem Cueto is a 53 y.o. male presenting on 09/02/2019 for Annual Exam   Weight loss noted - attributes to poor diet due to work stress   Preventative: Colon cancer screening - discussed, would like colonoscopy  Prostate cancer screening - reviewed. Would like to screen. Fmhx lung cancer (father age 52), pt smoker. Will qualify for lung cancer screening age 52yo. >30 PY hx.  Flu shot - today Tdap 2016 Meningococcal 04/2018 Seat belt use discussed. Sunscreen use discussed. No changing moles on the skin. Smoking - stopped 11/2017. Down to E-cig. Plans to stop E-cig by 2020. 30+ PY hx.  Alcohol - none  Dentist q3-4 mo Eye exam yearly   [name]: 4-6 cups coffee/day  Lives with wife, 3 children, 1 cat Occupation: Personal assistantlab manager, [employer]: PhD  Activity: soccer  Diet: seldom water, fruits/vegetables daily, seldom fast food, fish occasional     Relevant past medical, surgical, family and social history reviewed and updated as indicated. Interim medical history since our last visit reviewed. Allergies and medications reviewed and updated. Outpatient Medications Prior to Visit  Medication Sig Dispense Refill  . ibuprofen (ADVIL,MOTRIN) 200 MG tablet Take 200 mg by mouth every 6 (six) hours as needed.    . ciprofloxacin (CIPRO) 500 MG tablet Take 1 tablet (500 mg total) by mouth 2 (two) times daily. For traveler's diarrhea 6 tablet 0  . fluconazole (DIFLUCAN) 150 MG tablet Take 2 tablets (300 mg total) by mouth once a week. For 2 weeks (Patient not taking: Reported on 07/05/2018) 4 tablet 0   No facility-administered medications prior to visit.      Per HPI unless  specifically indicated in ROS section below Review of Systems  Constitutional: Negative for activity change, appetite change, chills, fatigue, fever and unexpected weight change.  HENT: Negative for hearing loss.   Eyes: Negative for visual disturbance.  Respiratory: Negative for cough, chest tightness, shortness of breath and wheezing.   Cardiovascular: Negative for chest pain, palpitations and leg swelling.  Gastrointestinal: Positive for blood in stool (x2 last week, no known hemorrhoids). Negative for abdominal distention, abdominal pain, constipation, diarrhea, nausea and vomiting.  Genitourinary: Negative for difficulty urinating and hematuria.  Musculoskeletal: Positive for arthralgias (L shoulder pain x months - after started golfing/swimming). Negative for myalgias and neck pain.  Skin: Negative for rash.  Neurological: Negative for dizziness, seizures, syncope and headaches.  Hematological: Negative for adenopathy. Does not bruise/bleed easily.  Psychiatric/Behavioral: Negative for dysphoric mood. The patient is not nervous/anxious.    Objective:    BP 102/60   Pulse (!) 56   Temp (!) 97.5 F (36.4 C)   Ht 5' 8.5" (1.74 m)   Wt 145 lb 7 oz (66 kg)   SpO2 100%   BMI 21.79 kg/m   Wt Readings from Last 3 Encounters:  09/02/19 145 lb 7 oz (66 kg)  07/05/18 153 lb 8 oz (69.6 kg)  06/16/18 155 lb 4 oz (70.4 kg)    Physical Exam Vitals signs and nursing note reviewed.  Constitutional:  General: He is not in acute distress.    Appearance: Normal appearance. He is well-developed. He is not ill-appearing.  HENT:     Head: Normocephalic and atraumatic.     Right Ear: Hearing, tympanic membrane, ear canal and external ear normal.     Left Ear: Hearing, tympanic membrane, ear canal and external ear normal.     Nose: Nose normal.     Mouth/Throat:     Mouth: Mucous membranes are moist.     Pharynx: Oropharynx is clear. Uvula midline. No posterior oropharyngeal erythema.   Eyes:     General: No scleral icterus.    Extraocular Movements: Extraocular movements intact.     Conjunctiva/sclera: Conjunctivae normal.     Pupils: Pupils are equal, round, and reactive to light.  Neck:     Musculoskeletal: Normal range of motion and neck supple.  Cardiovascular:     Rate and Rhythm: Normal rate and regular rhythm.     Pulses: Normal pulses.          Radial pulses are 2+ on the right side and 2+ on the left side.     Heart sounds: Normal heart sounds. No murmur.  Pulmonary:     Effort: Pulmonary effort is normal. No respiratory distress.     Breath sounds: Normal breath sounds. No wheezing, rhonchi or rales.  Abdominal:     General: Abdomen is flat. Bowel sounds are normal. There is no distension.     Palpations: Abdomen is soft. There is no mass.     Tenderness: There is no abdominal tenderness. There is no guarding or rebound.     Hernia: No hernia is present.  Musculoskeletal: Normal range of motion.        General: No swelling or tenderness.     Right lower leg: No edema.     Left lower leg: No edema.     Comments:  R shoulder WNL L shoulder exam: No deformity of shoulders on inspection. No pain with palpation of shoulder landmarks. FROM in abduction and forward flexion. No pain or weakness with testing SITS in ext/int rotation. No pain with empty can sign. Neg Yerguson, Speed test. No impingement. No pain with crossover test. No pain with rotation of humeral head in GH joint.  Reproducible tenderness at [facility] joint with passive abduction of L shoulder past 90 degrees  Lymphadenopathy:     Cervical: No cervical adenopathy.  Skin:    General: Skin is warm and dry.     Capillary Refill: Capillary refill takes less than 2 seconds.     Findings: No rash.  Neurological:     General: No focal deficit present.     Mental Status: He is alert and oriented to person, place, and time.     Comments: CN grossly intact, station and gait intact  Psychiatric:         Mood and Affect: Mood normal.        Behavior: Behavior normal.        Thought Content: Thought content normal.        Judgment: Judgment normal.       Results for orders placed or performed in visit on 08/26/19  PSA  Result Value Ref Range   PSA 0.80 0.10 - 4.00 ng/mL  Comprehensive metabolic panel  Result Value Ref Range   Sodium 140 135 - 145 mEq/L   Potassium 4.3 3.5 - 5.1 mEq/L   Chloride 106 96 - 112 mEq/L   CO2 28  19 - 32 mEq/L   Glucose, Bld 93 70 - 99 mg/dL   BUN 19 6 - 23 mg/dL   Creatinine, Ser 1.00 0.40 - 1.50 mg/dL   Total Bilirubin 0.6 0.2 - 1.2 mg/dL   Alkaline Phosphatase 42 39 - 117 U/L   AST 12 0 - 37 U/L   ALT 9 0 - 53 U/L   Total Protein 6.6 6.0 - 8.3 g/dL   Albumin 4.2 3.5 - 5.2 g/dL   Calcium 9.8 8.4 - 10.5 mg/dL   GFR 78.45 >60.00 mL/min  Lipid panel  Result Value Ref Range   Cholesterol 202 (H) 0 - 200 mg/dL   Triglycerides 156.0 (H) 0.0 - 149.0 mg/dL   HDL 47.40 >39.00 mg/dL   VLDL 31.2 0.0 - 40.0 mg/dL   LDL Cholesterol 124 (H) 0 - 99 mg/dL   Total CHOL/HDL Ratio 4    NonHDL 154.93    Assessment & Plan:   Problem List Items Addressed This Visit    Smoker    E-cig use. Encouraged full cessation.       Healthcare maintenance - Primary    Preventative protocols reviewed and updated unless pt declined. Discussed healthy diet and lifestyle.       Dyslipidemia    Chronic, mild off med. The ASCVD Risk score Mikey Bussing DC Jr., et al., 2013) failed to calculate for the following reasons:   Unable to determine if patient is Non-Hispanic African American       Chronic left shoulder pain    Not consistent with RTC injury or bursitis. ?shoulder or AC joint arthritis. Discussed tylenol or ibuprofen use. Update if worsening for xrays. Pt agrees with plan.       BRBPR (bright red blood per rectum)    Isolated episode. Referred to GI for colonoscopy.        Other Visit Diagnoses    Special screening for malignant neoplasms, colon        Relevant Orders   Ambulatory referral to Gastroenterology       Meds ordered this encounter  Medications  . vitamin C (ASCORBIC ACID) 500 MG tablet    Sig: Take 1 tablet (500 mg total) by mouth daily.    Dispense:      Orders Placed This Encounter  Procedures  . Flu Vaccine QUAD 6+ mos PF IM (Fluarix Quad PF)  . Ambulatory referral to Gastroenterology    Referral Priority:   Routine    Referral Type:   Consultation    Referral Reason:   Specialty Services Required    Number of Visits Requested:   1    Follow up plan: Return in about 1 year (around 09/01/2020) for annual exam, prior fasting for blood work.  Ria Bush, MD

## 2019-09-02 NOTE — Assessment & Plan Note (Signed)
Isolated episode. Referred to GI for colonoscopy.

## 2019-09-02 NOTE — Assessment & Plan Note (Signed)
Not consistent with RTC injury or bursitis. ?shoulder or AC joint arthritis. Discussed tylenol or ibuprofen use. Update if worsening for xrays. Pt agrees with plan.

## 2019-09-02 NOTE — Assessment & Plan Note (Signed)
Chronic, mild off med. The ASCVD Risk score Mikey Bussing DC Jr., et al., 2013) failed to calculate for the following reasons:   Unable to determine if patient is Non-Hispanic African American

## 2019-09-02 NOTE — Assessment & Plan Note (Signed)
E-cig use. Encouraged full cessation.

## 2019-09-02 NOTE — Patient Instructions (Addendum)
Flu shot today We have placed new referral to GI for colonoscopy.  Continue working on quitting E-cig. Work on regular mealtimes  Let us know if ongoing weight loss. Possible arthritis of left shoulder - may treat with tylenol/ibuprofen. Let us know if not improving to consider xray.  Return as needed or in 1 year for next physical.   Health Maintenance, Male Adopting a healthy lifestyle and getting preventive care are important in promoting health and wellness. Ask your health care provider about:  The right schedule for you to have regular tests and exams.  Things you can do on your own to prevent diseases and keep yourself healthy. What should I know about diet, weight, and exercise? Eat a healthy diet   Eat a diet that includes plenty of vegetables, fruits, low-fat dairy products, and lean protein.  Do not eat a lot of foods that are high in solid fats, added sugars, or sodium. Maintain a healthy weight Body mass index (BMI) is a measurement that can be used to identify possible weight problems. It estimates body fat based on height and weight. Your health care provider can help determine your BMI and help you achieve or maintain a healthy weight. Get regular exercise Get regular exercise. This is one of the most important things you can do for your health. Most adults should:  Exercise for at least 150 minutes each week. The exercise should increase your heart rate and make you sweat (moderate-intensity exercise).  Do strengthening exercises at least twice a week. This is in addition to the moderate-intensity exercise.  Spend less time sitting. Even light physical activity can be beneficial. Watch cholesterol and blood lipids Have your blood tested for lipids and cholesterol at 52 years of age, then have this test every 5 years. You may need to have your cholesterol levels checked more often if:  Your lipid or cholesterol levels are high.  You are older than 52 years of  age.  You are at high risk for heart disease. What should I know about cancer screening? Many types of cancers can be detected early and may often be prevented. Depending on your health history and family history, you may need to have cancer screening at various ages. This may include screening for:  Colorectal cancer.  Prostate cancer.  Skin cancer.  Lung cancer. What should I know about heart disease, diabetes, and high blood pressure? Blood pressure and heart disease  High blood pressure causes heart disease and increases the risk of stroke. This is more likely to develop in people who have high blood pressure readings, are of African descent, or are overweight.  Talk with your health care provider about your target blood pressure readings.  Have your blood pressure checked: ? Every 3-5 years if you are 11-46 years of age. ? Every year if you are 49 years old or older.  If you are between the ages of 63 and 22 and are a current or former smoker, ask your health care provider if you should have a one-time screening for abdominal aortic aneurysm (AAA). Diabetes Have regular diabetes screenings. This checks your fasting blood sugar level. Have the screening done:  Once every three years after age 58 if you are at a normal weight and have a low risk for diabetes.  More often and at a younger age if you are overweight or have a high risk for diabetes. What should I know about preventing infection? Hepatitis B If you have a higher risk  for hepatitis B, you should be screened for this virus. Talk with your health care provider to find out if you are at risk for hepatitis B infection. Hepatitis C Blood testing is recommended for:  Everyone born from [birth year] through 1964.  Anyone with known risk factors for hepatitis C. Sexually transmitted infections (STIs)  You should be screened each year for STIs, including gonorrhea and chlamydia, if: ? You are sexually active and are younger  than 52 years of age. ? You are older than 53 years of age and your health care provider tells you that you are at risk for this type of infection. ? Your sexual activity has changed since you were last screened, and you are at increased risk for chlamydia or gonorrhea. Ask your health care provider if you are at risk.  Ask your health care provider about whether you are at high risk for HIV. Your health care provider may recommend a prescription medicine to help prevent HIV infection. If you choose to take medicine to prevent HIV, you should first get tested for HIV. You should then be tested every 3 months for as long as you are taking the medicine. Follow these instructions at home: Lifestyle  Do not use any products that contain nicotine or tobacco, such as cigarettes, e-cigarettes, and chewing tobacco. If you need help quitting, ask your health care provider.  Do not use street drugs.  Do not share needles.  Ask your health care provider for help if you need support or information about quitting drugs. Alcohol use  Do not drink alcohol if your health care provider tells you not to drink.  If you drink alcohol: ? Limit how much you have to 0-2 drinks a day. ? Be aware of how much alcohol is in your drink. In the [place] one drink equals one 12 oz bottle of beer (355 mL), one 5 oz glass of wine (148 mL), or one 1 oz glass of hard liquor (44 mL). General instructions  Schedule regular health, dental, and eye exams.  Stay current with your vaccines.  Tell your health care provider if: ? You often feel depressed. ? You have ever been abused or do not feel safe at home. Summary  Adopting a healthy lifestyle and getting preventive care are important in promoting health and wellness.  Follow your health care provider's instructions about healthy diet, exercising, and getting tested or screened for diseases.  Follow your health care provider's instructions on monitoring your  cholesterol and blood pressure. This information is not intended to replace advice given to you by your health care provider. Make sure you discuss any questions you have with your health care provider. Document Released: 05/22/2008 Document Revised: 11/17/2018 Document Reviewed: 11/17/2018 Elsevier Patient Education  2020 [employer].

## 2019-09-02 NOTE — Assessment & Plan Note (Signed)
Preventative protocols reviewed and updated unless pt declined. Discussed healthy diet and lifestyle.  

## 2019-09-23 NOTE — Addendum Note (Signed)
Addended by: Ria Bush on: 09/23/2019 02:20 PM   Modules accepted: Orders

## 2019-11-18 DIAGNOSIS — Z01812 Encounter for preprocedural laboratory examination: Secondary | ICD-10-CM | POA: Diagnosis not present

## 2019-11-18 DIAGNOSIS — Z1211 Encounter for screening for malignant neoplasm of colon: Secondary | ICD-10-CM | POA: Diagnosis not present

## 2019-11-18 DIAGNOSIS — K625 Hemorrhage of anus and rectum: Secondary | ICD-10-CM | POA: Diagnosis not present

## 2019-11-18 DIAGNOSIS — E785 Hyperlipidemia, unspecified: Secondary | ICD-10-CM | POA: Diagnosis not present

## 2019-12-09 HISTORY — PX: COLONOSCOPY: SHX174

## 2019-12-13 DIAGNOSIS — Z01812 Encounter for preprocedural laboratory examination: Secondary | ICD-10-CM | POA: Diagnosis not present

## 2019-12-16 DIAGNOSIS — K635 Polyp of colon: Secondary | ICD-10-CM | POA: Diagnosis not present

## 2019-12-16 DIAGNOSIS — Z1211 Encounter for screening for malignant neoplasm of colon: Secondary | ICD-10-CM | POA: Diagnosis not present

## 2019-12-29 ENCOUNTER — Telehealth: Payer: Self-pay | Admitting: Family Medicine

## 2019-12-29 NOTE — Telephone Encounter (Signed)
Patient stated he was referred to have a colonoscopy . He was advised that the results would be sent over to our office. He would like to know if these have come back and if we can call to give those results to him   Please advise

## 2019-12-30 NOTE — Telephone Encounter (Signed)
Pt requesting colonoscopy results; pt not having problem but said the doctor found a polyp and pt wants to know results.  Not in pts chart and not in care everywhere. Pt said OK but request cb when gets colonoscopy results. FYI to Dr Reece Agar. Pt will cb sooner than cb if needed.

## 2020-01-03 NOTE — Telephone Encounter (Signed)
I have not received records. plz request colonoscopy to review.

## 2020-01-04 NOTE — Telephone Encounter (Signed)
Faxed records request to [facility]- Dr. Norma Fredrickson at [phone].

## 2020-01-24 ENCOUNTER — Encounter: Payer: Self-pay | Admitting: Family Medicine

## 2020-01-24 NOTE — Telephone Encounter (Signed)
Pt notified as instructed by phone.  Verbalizes understanding.  

## 2020-01-24 NOTE — Telephone Encounter (Signed)
plz call - received colonoscopy report - overall reassuring. Initially what thought to be polyp on pathology review was actually just slightly inflamed but normal colon tissue.  Repeat would be due in 10 years.

## 2020-02-08 ENCOUNTER — Observation Stay
Admission: EM | Admit: 2020-02-08 | Discharge: 2020-02-09 | Disposition: A | Discharge status: Home / Self Care | Payer: BC Managed Care – PPO | Attending: Internal Medicine | Admitting: Internal Medicine

## 2020-02-08 ENCOUNTER — Emergency Department: Payer: BC Managed Care – PPO

## 2020-02-08 ENCOUNTER — Other Ambulatory Visit: Payer: Self-pay

## 2020-02-08 ENCOUNTER — Inpatient Hospital Stay: Admit: 2020-02-08 | Payer: BC Managed Care – PPO

## 2020-02-08 ENCOUNTER — Inpatient Hospital Stay: Payer: BC Managed Care – PPO

## 2020-02-08 ENCOUNTER — Inpatient Hospital Stay
Admit: 2020-02-08 | Discharge: 2020-02-08 | Disposition: A | Discharge status: Home / Self Care | Payer: BC Managed Care – PPO | Attending: Internal Medicine | Admitting: Internal Medicine

## 2020-02-08 DIAGNOSIS — Z20822 Contact with and (suspected) exposure to covid-19: Secondary | ICD-10-CM | POA: Diagnosis not present

## 2020-02-08 DIAGNOSIS — I2111 ST elevation (STEMI) myocardial infarction involving right coronary artery: Secondary | ICD-10-CM

## 2020-02-08 DIAGNOSIS — E785 Hyperlipidemia, unspecified: Secondary | ICD-10-CM | POA: Diagnosis not present

## 2020-02-08 DIAGNOSIS — M50222 Other cervical disc displacement at C5-C6 level: Secondary | ICD-10-CM | POA: Insufficient documentation

## 2020-02-08 DIAGNOSIS — G459 Transient cerebral ischemic attack, unspecified: Secondary | ICD-10-CM | POA: Diagnosis not present

## 2020-02-08 DIAGNOSIS — I209 Angina pectoris, unspecified: Secondary | ICD-10-CM | POA: Diagnosis not present

## 2020-02-08 DIAGNOSIS — R079 Chest pain, unspecified: Secondary | ICD-10-CM

## 2020-02-08 DIAGNOSIS — F17290 Nicotine dependence, other tobacco product, uncomplicated: Secondary | ICD-10-CM | POA: Diagnosis not present

## 2020-02-08 DIAGNOSIS — I4510 Unspecified right bundle-branch block: Secondary | ICD-10-CM | POA: Insufficient documentation

## 2020-02-08 DIAGNOSIS — Z79899 Other long term (current) drug therapy: Secondary | ICD-10-CM | POA: Insufficient documentation

## 2020-02-08 DIAGNOSIS — M2578 Osteophyte, vertebrae: Secondary | ICD-10-CM | POA: Diagnosis not present

## 2020-02-08 DIAGNOSIS — R200 Anesthesia of skin: Secondary | ICD-10-CM | POA: Diagnosis not present

## 2020-02-08 DIAGNOSIS — I213 ST elevation (STEMI) myocardial infarction of unspecified site: Secondary | ICD-10-CM | POA: Diagnosis not present

## 2020-02-08 DIAGNOSIS — Z87891 Personal history of nicotine dependence: Secondary | ICD-10-CM | POA: Diagnosis present

## 2020-02-08 DIAGNOSIS — R202 Paresthesia of skin: Secondary | ICD-10-CM

## 2020-02-08 DIAGNOSIS — R0789 Other chest pain: Secondary | ICD-10-CM | POA: Diagnosis not present

## 2020-02-08 DIAGNOSIS — M4802 Spinal stenosis, cervical region: Secondary | ICD-10-CM | POA: Insufficient documentation

## 2020-02-08 DIAGNOSIS — F17200 Nicotine dependence, unspecified, uncomplicated: Secondary | ICD-10-CM | POA: Diagnosis present

## 2020-02-08 LAB — TROPONIN I (HIGH SENSITIVITY)
Troponin I (High Sensitivity): <2 ng/L (ref <18)
Troponin I (High Sensitivity): <2 ng/L (ref <18)
Troponin I (High Sensitivity): <2 ng/L (ref <18)

## 2020-02-08 LAB — ECHOCARDIOGRAM COMPLETE
Height: 70 in
Weight: 2400 oz

## 2020-02-08 LAB — PROTIME-INR
INR: 0.9 (ref 0.8–1.2)
Prothrombin Time: 12.1 seconds (ref 11.4–15.2)

## 2020-02-08 LAB — APTT: aPTT: 28 seconds (ref 24–36)

## 2020-02-08 LAB — COMPREHENSIVE METABOLIC PANEL
ALT: 15 U/L (ref 0–44)
AST: 18 U/L (ref 15–41)
Albumin: 4.2 g/dL (ref 3.5–5.0)
Alkaline Phosphatase: 47 U/L (ref 38–126)
Anion gap: 10 (ref 5–15)
BUN: 17 mg/dL (ref 6–20)
CO2: 27 mmol/L (ref 22–32)
Calcium: 9.6 mg/dL (ref 8.9–10.3)
Chloride: 103 mmol/L (ref 98–111)
Creatinine, Ser: 0.87 mg/dL (ref 0.61–1.24)
GFR calc Af Amer: >60 mL/min (ref >60)
GFR calc non Af Amer: >60 mL/min (ref >60)
Glucose, Bld: 90 mg/dL (ref 70–99)
Potassium: 4 mmol/L (ref 3.5–5.1)
Sodium: 140 mmol/L (ref 135–145)
Total Bilirubin: 0.8 mg/dL (ref 0.3–1.2)
Total Protein: 7.2 g/dL (ref 6.5–8.1)

## 2020-02-08 LAB — CBC WITH DIFFERENTIAL/PLATELET
Abs Immature Granulocytes: 0.02 10*3/uL (ref 0.00–0.07)
Basophils Absolute: 0 10*3/uL (ref 0.0–0.1)
Basophils Relative: 1 %
Eosinophils Absolute: 0.1 10*3/uL (ref 0.0–0.5)
Eosinophils Relative: 1 %
HCT: 40.8 % (ref 39.0–52.0)
Hemoglobin: 13.9 g/dL (ref 13.0–17.0)
Immature Granulocytes: 0 %
Lymphocytes Relative: 41 %
Lymphs Abs: 2.6 10*3/uL (ref 0.7–4.0)
MCH: 30.7 pg (ref 26.0–34.0)
MCHC: 34.1 g/dL (ref 30.0–36.0)
MCV: 90.1 fL (ref 80.0–100.0)
Monocytes Absolute: 0.6 10*3/uL (ref 0.1–1.0)
Monocytes Relative: 10 %
Neutro Abs: 3.1 10*3/uL (ref 1.7–7.7)
Neutrophils Relative %: 47 %
Platelets: 225 10*3/uL (ref 150–400)
RBC: 4.53 MIL/uL (ref 4.22–5.81)
RDW: 11.6 % (ref 11.5–15.5)
WBC: 6.4 10*3/uL (ref 4.0–10.5)
nRBC: 0 % (ref 0.0–0.2)

## 2020-02-08 LAB — HEPARIN LEVEL (UNFRACTIONATED)
Heparin Unfractionated: 0.37 IU/mL (ref 0.30–0.70)
Heparin Unfractionated: 0.3 IU/mL (ref 0.30–0.70)

## 2020-02-08 LAB — HIV ANTIBODY (ROUTINE TESTING W REFLEX): HIV Screen 4th Generation wRfx: NONREACTIVE

## 2020-02-08 LAB — URINALYSIS, ROUTINE W REFLEX MICROSCOPIC
Bilirubin Urine: NEGATIVE
Glucose, UA: NEGATIVE mg/dL
Hgb urine dipstick: NEGATIVE
Ketones, ur: NEGATIVE mg/dL
Leukocytes,Ua: NEGATIVE
Nitrite: NEGATIVE
Protein, ur: NEGATIVE mg/dL
Specific Gravity, Urine: 1.019 (ref 1.005–1.030)
pH: 5 (ref 5.0–8.0)

## 2020-02-08 LAB — RESPIRATORY PANEL BY RT PCR (FLU A&B, COVID)
Influenza A by PCR: NEGATIVE
Influenza B by PCR: NEGATIVE
SARS Coronavirus 2 by RT PCR: NEGATIVE

## 2020-02-08 LAB — BRAIN NATRIURETIC PEPTIDE: B Natriuretic Peptide: 14 pg/mL (ref 0.0–100.0)

## 2020-02-08 LAB — LIPASE, BLOOD: Lipase: 22 U/L (ref 11–51)

## 2020-02-08 MED ORDER — METOPROLOL TARTRATE 25 MG PO TABS: 12.5000 mg | ORAL_TABLET | Freq: Two times a day (BID) | ORAL | Status: DC

## 2020-02-08 MED ORDER — ASPIRIN 300 MG RE SUPP: 300.0000 mg | RECTAL | Status: DC

## 2020-02-08 MED ORDER — VITAMIN D 25 MCG (1000 UNIT) PO TABS
1000.0000 [IU] | ORAL_TABLET | Freq: Every day | ORAL | Status: DC
  Administered 2020-02-08 – 2020-02-09 (×2): 1000 [IU] via ORAL
  Filled 2020-02-08 (×2): qty 1

## 2020-02-08 MED ORDER — ASPIRIN 81 MG PO CHEW
324.0000 mg | CHEWABLE_TABLET | Freq: Once | ORAL | Status: AC
  Administered 2020-02-08: 324 mg via ORAL
  Filled 2020-02-08: qty 4

## 2020-02-08 MED ORDER — NITROGLYCERIN 0.4 MG SL SUBL: 0.4000 mg | SUBLINGUAL_TABLET | SUBLINGUAL | Status: DC | PRN

## 2020-02-08 MED ORDER — ACETAMINOPHEN 325 MG PO TABS: 650.0000 mg | ORAL_TABLET | ORAL | Status: DC | PRN

## 2020-02-08 MED ORDER — ASPIRIN EC 81 MG PO TBEC
81.0000 mg | DELAYED_RELEASE_TABLET | Freq: Every day | ORAL | Status: DC
  Administered 2020-02-09: 81 mg via ORAL
  Filled 2020-02-08: qty 1

## 2020-02-08 MED ORDER — ASPIRIN 81 MG PO CHEW: 324.0000 mg | CHEWABLE_TABLET | ORAL | Status: DC

## 2020-02-08 MED ORDER — ATORVASTATIN CALCIUM 20 MG PO TABS
40.0000 mg | ORAL_TABLET | Freq: Every day | ORAL | Status: DC
  Administered 2020-02-08 – 2020-02-09 (×2): 40 mg via ORAL
  Filled 2020-02-08 (×2): qty 2

## 2020-02-08 MED ORDER — ONDANSETRON HCL 4 MG/2ML IJ SOLN: 4.0000 mg | Freq: Four times a day (QID) | INTRAMUSCULAR | Status: DC | PRN

## 2020-02-08 MED ORDER — METOPROLOL TARTRATE 25 MG PO TABS: 25.0000 mg | ORAL_TABLET | Freq: Two times a day (BID) | ORAL | Status: DC

## 2020-02-08 MED ORDER — HEPARIN (PORCINE) 25000 UT/250ML-% IV SOLN
900.0000 [IU]/h | INTRAVENOUS | Status: DC
  Administered 2020-02-08 (×2): 800 [IU]/h via INTRAVENOUS
  Filled 2020-02-08 (×2): qty 250

## 2020-02-08 MED ORDER — HEPARIN BOLUS VIA INFUSION
4000.0000 [IU] | Freq: Once | INTRAVENOUS | Status: AC
  Administered 2020-02-08: 4000 [IU] via INTRAVENOUS
  Filled 2020-02-08: qty 4000

## 2020-02-08 NOTE — Progress Notes (Signed)
Patient's Heparin drip had to be change over to an MRI friendly machine.   Drip was changed over, but it looks as if it was stop in Epic.  Patient has not had a interruption of heparin drip.

## 2020-02-08 NOTE — Consult Note (Signed)
ANTICOAGULATION CONSULT NOTE - Initial Consult  Pharmacy Consult for Heparin Infusion  Indication: chest pain/ACS  No Known Allergies  Patient Measurements: Height: 5\' 10"  (177.8 cm) Weight: 150 lb (68 kg) IBW/kg (Calculated) : 73  Vital Signs: Temp: 98.6 F (37 C) (03/03 0855) Temp Source: Oral (03/03 0850) BP: 111/79 (03/03 0850) Pulse Rate: 63 (03/03 0850)  Labs: Recent Labs    02/08/20 0856  HGB 13.9  HCT 40.8  PLT 225  APTT 28  LABPROT 12.1  INR 0.9  CREATININE 0.87  TROPONINIHS <2    Estimated Creatinine Clearance: 95.5 mL/min (by C-G formula based on SCr of 0.87 mg/dL).   Medical History: Past Medical History:  Diagnosis Date  . History of chicken pox   . History of nephrolithiasis 2012  . Smoker     Assessment: Pharmacy consulted for heparin infusion dosing and monitoring for 54 yo male admitted with ACS/STEMI.   Goal of Therapy:  Heparin level 0.3-0.7 units/ml Monitor platelets by anticoagulation protocol: Yes   Plan:  Baseline labs ordered  Give 4000 units bolus x 1 Start heparin infusion at 800 units/hr Check anti-Xa level in 6 hours and daily while on heparin Continue to monitor H&H and platelets  44, PharmD, BCPS Clinical Pharmacist 02/08/2020 9:58 AM

## 2020-02-08 NOTE — ED Provider Notes (Signed)
[facility] Emergency Department Provider Note  ____________________________________________   First MD Initiated Contact with Patient 02/08/20 0845     (approximate)  I have reviewed the triage vital signs and the nursing notes.   HISTORY  Chief Complaint Chest Pain and Numbness    HPI Derrick Young is a 54 y.o. male who is otherwise healthy comes in with chest pain.  Patient states that yesterday he had some heaviness of his left arm that went up into his jaw.  There really was not any pain associated with it but more of like a tingling sensation.  This lasted for about a minute and then went away.  This occurred around 11 PM on 3/2.  Patient woke up this morning around 6 AM he started to have a little bit of discomfort in his chest.  It was a 5 out of 10, constant, nothing made it better, nothing made it worse.  States that has now resolved completely.  Patient states that he feels his normal baseline.  Denies any shortness of breath, fevers, cough.          Past Medical History:  Diagnosis Date  . History of chicken pox   . History of nephrolithiasis 2012  . Smoker     Patient Active Problem List   Diagnosis Date Noted  . Chronic left shoulder pain 09/02/2019  . BRBPR (bright red blood per rectum) 09/02/2019  . Dyslipidemia 08/26/2019  . HSV-1 infection 07/05/2018  . Tinea versicolor 06/17/2018  . Travel advice encounter 06/17/2018  . Onychomycosis 01/07/2017  . Healthcare maintenance 03/31/2013  . Smoker   . History of nephrolithiasis     Past Surgical History:  Procedure Laterality Date  . COLONOSCOPY  12/2019   benign polyp, rpt 10 yrs [facility])  . LITHOTRIPSY  03/2013  . REFRACTIVE SURGERY Bilateral 03/2019   Henrene Pastor in [place]    Prior to Admission medications   Medication Sig Start Date End Date Taking? Authorizing Provider  ibuprofen (ADVIL,MOTRIN) 200 MG tablet Take 200 mg by mouth every 6 (six) hours as needed.    [provider]  vitamin C (ASCORBIC ACID) 500 MG tablet Take 1 tablet (500 mg total) by mouth daily. 09/02/19   Ria Bush, MD    Allergies Patient has no known allergies.  Family History  Problem Relation Age of Onset  . Cancer Paternal Uncle 36       lung (smoker)  . Cancer Paternal Uncle 55       throat  . Emphysema Father        smoker  . Urolithiasis Mother   . CAD Neg Hx   . Stroke Neg Hx   . Diabetes Neg Hx   . Hypertension Neg Hx     Social History Social History   Tobacco Use  . Smoking status: Current Some Day Smoker    Types: E-cigarettes    Start date: 12/09/1983  . Smokeless tobacco: Never Used  . Tobacco comment: quit smoking cigarettes/cigars 11/2017  Substance Use Topics  . Alcohol use: No  . Drug use: No      Review of Systems Constitutional: No fever/chills Eyes: No visual changes. ENT: No sore throat. Cardiovascular: Positive chest pain Respiratory: Denies shortness of breath. Gastrointestinal: No abdominal pain.  No nausea, no vomiting.  No diarrhea.  No constipation. Genitourinary: Negative for dysuria. Musculoskeletal: Negative for back pain. Skin: Negative for rash. Neurological: Negative for headaches, focal weakness.  Positive left arm numbness All  other ROS negative ____________________________________________   PHYSICAL EXAM:  VITAL SIGNS: Blood pressure 99/75, pulse (!) 57, temperature 98.6 F (37 C), resp. rate 18, height 5\' 10"  (1.778 m), weight 68 kg, SpO2 97 %.  Constitutional: Alert and oriented. Well appearing and in no acute distress. Eyes: Conjunctivae are normal. EOMI. Head: Atraumatic. Nose: No congestion/rhinnorhea. Mouth/Throat: Mucous membranes are moist.   Neck: No stridor. Trachea Midline. FROM Cardiovascular: Normal rate, regular rhythm. Grossly normal heart sounds.  Good peripheral circulation. Respiratory: Normal respiratory effort.  No retractions. Lungs CTAB. Gastrointestinal: Soft and nontender. No  distention. No abdominal bruits.  Musculoskeletal: No lower extremity tenderness nor edema.  No joint effusions. Neurologic:  Normal speech and language. No gross focal neurologic deficits are appreciated.  Cranial nerves II to XII are intact.  NIH stroke scale of 0 Skin:  Skin is warm, dry and intact. No rash noted. Psychiatric: Mood and affect are normal. Speech and behavior are normal. GU: Deferred   ____________________________________________   LABS (all labs ordered are listed, but only abnormal results are displayed)  Labs Reviewed  URINALYSIS, ROUTINE W REFLEX MICROSCOPIC - Abnormal; Notable for the following components:      Result Value   Color, Urine AMBER (*)    APPearance HAZY (*)    All other components within normal limits  RESPIRATORY PANEL BY RT PCR (FLU A&B, COVID)  CBC WITH DIFFERENTIAL/PLATELET  COMPREHENSIVE METABOLIC PANEL  LIPASE, BLOOD  BRAIN NATRIURETIC PEPTIDE  PROTIME-INR  APTT  HEPARIN LEVEL (UNFRACTIONATED)  HIV ANTIBODY (ROUTINE TESTING W REFLEX)  TROPONIN I (HIGH SENSITIVITY)  TROPONIN I (HIGH SENSITIVITY)   ____________________________________________   ED ECG REPORT I, , the attending physician, personally viewed and interpreted this ECG.  EKG is sinus rate of 60, right bundle branch block, ST elevation in the inferior leads.  No ST depressions ____________________________________________  RADIOLOGY Concha Se, personally viewed and evaluated these images (plain radiographs) as part of my medical decision making, as well as reviewing the written report by the radiologist.  ED MD interpretation: No widened mediastinum  Official radiology report(s): CT Head Wo Contrast  Result Date: 02/08/2020 CLINICAL DATA:  TIA.  Left side numbness EXAM: CT HEAD WITHOUT CONTRAST TECHNIQUE: Contiguous axial images were obtained from the base of the skull through the vertex without intravenous contrast. COMPARISON:  None. FINDINGS: Brain:  No acute intracranial abnormality. Specifically, no hemorrhage, hydrocephalus, mass lesion, acute infarction, or significant intracranial injury. Vascular: No hyperdense vessel or unexpected calcification. Skull: No acute calvarial abnormality. Sinuses/Orbits: Visualized paranasal sinuses and mastoids clear. Orbital soft tissues unremarkable. Other: None IMPRESSION: Normal study. Electronically Signed   By: 04/09/2020 M.D.   On: 02/08/2020 09:51   DG Chest Portable 1 View  Result Date: 02/08/2020 CLINICAL DATA:  Episode of chest pain this morning. EXAM: PORTABLE CHEST 1 VIEW COMPARISON:  None. FINDINGS: The cardiac silhouette, mediastinal and hilar contours are normal. The lungs are clear. No pleural effusion or pneumothorax. No pulmonary lesions. The bony thorax is intact. IMPRESSION: No acute cardiopulmonary findings. Electronically Signed   By: 04/09/2020 M.D.   On: 02/08/2020 09:23    ____________________________________________   PROCEDURES  Procedure(s) performed (including Critical Care):  .Critical Care Performed by: 04/09/2020, MD Authorized by: Concha Se, MD   Critical care provider statement:    Critical care time (minutes):  31   Critical care was necessary to treat or prevent imminent or life-threatening deterioration of the following conditions:  Cardiac failure   Critical care was time spent personally by me on the following activities:  Discussions with consultants, evaluation of patient's response to treatment, examination of patient, ordering and performing treatments and interventions, ordering and review of laboratory studies, ordering and review of radiographic studies, pulse oximetry, re-evaluation of patient's condition, obtaining history from patient or surrogate and review of old charts     ____________________________________________   INITIAL IMPRESSION / ASSESSMENT AND PLAN / ED COURSE   Alexandr Mallozzi was evaluated in Emergency Department on  02/08/2020 for the symptoms described in the history of present illness. He was evaluated in the context of the global COVID-19 pandemic, which necessitated consideration that the patient might be at risk for infection with the SARS-CoV-2 virus that causes COVID-19. Institutional protocols and algorithms that pertain to the evaluation of patients at risk for COVID-19 are in a state of rapid change based on information released by regulatory bodies including the CDC and federal and state organizations. These policies and algorithms were followed during the patient's care in the ED.   Patient comes in with left arm tingling and heaviness feeling which then resolved developed chest pain the next day.  Will get EKG cardiac markers to evaluate for ACS.  Low suspicion for dissection given no chest pain currently, good pulses bilaterally, chest x-ray without widened mediastinum.  Consider TIA versus stroke.  NIH stroke scale at this time is 0.  Will get CT head to make sure no evidence of intracranial hemorrhage.  EKG had been placed on my desk.  Upon evaluation of the there was concern for STEMI although no reciprocal changes.  Evidently got a repeat EKG that looked unchanged.  Will discuss with the STEMI doctor.  9:36 AM discussed with Dr. Juliann Pares about concern for acute MI on EKG with the 1 mm ST elevation in inferior leads.  Patient is currently chest pain-free.  However there are no reciprocal changes and he does not think Cath Lab activation is necessary at this time.  Patient story is concerning and he recommended aspirin and starting heparin.  Given the concern for some neuro deficit will get CT head first just make sure no signs of intracranial hemorrhage.  Dr. Juliann Pares would recommend admission to the medicine team for trending out cardiac markers and they will need to reconsult him for further recommendations.  Troponin was negative but given onset of the chest pain this morning we will need to trend  these out.  Will discuss with hospital team for admission.   ____________________________________________   FINAL CLINICAL IMPRESSION(S) / ED DIAGNOSES   Final diagnoses:  Chest pain, unspecified type  Paresthesias     MEDICATIONS GIVEN DURING THIS VISIT:  Medications  heparin bolus via infusion 4,000 Units (4,000 Units Intravenous Bolus from Bag 02/08/20 1011)    Followed by  heparin ADULT infusion 100 units/mL (25000 units/232mL sodium chloride 0.45%) (800 Units/hr Intravenous New Bag/Given 02/08/20 1012)  cholecalciferol (VITAMIN D3) tablet 1,000 Units (has no administration in time range)  aspirin EC tablet 81 mg (has no administration in time range)  nitroGLYCERIN (NITROSTAT) SL tablet 0.4 mg (has no administration in time range)  acetaminophen (TYLENOL) tablet 650 mg (has no administration in time range)  ondansetron (ZOFRAN) injection 4 mg (has no administration in time range)  atorvastatin (LIPITOR) tablet 40 mg (has no administration in time range)  metoprolol tartrate (LOPRESSOR) tablet 12.5 mg (has no administration in time range)  aspirin chewable tablet 324 mg (324 mg Oral Given  02/08/20 1006)     ED Discharge Orders    None       Note:  This document was prepared using Dragon voice recognition software and may include unintentional dictation errors.   Concha Se, MD 02/08/20 1145

## 2020-02-08 NOTE — Consult Note (Signed)
Requesting Physician: Agbata    Chief Complaint: Chest pain  I have been asked by Dr. Joylene Igo to see this patient in consultation for left arm numbness.  HPI: Derrick Young is an 54 y.o. male who reports that yesterday he was watching television and had acute onset of left arm numbness that travelled up the arm and into his left cheek and jaw.  Symptoms lasted a few seconds and resolved only leaving him with some tingling on the face that resolved after 1-2 minutes.  Today experienced some chest pressure without the left sided symptoms and presented for evaluation.  Patient has had no previous focal neurological symptoms and has not had return of his previous symptoms since yesterday.  Initial NIHSS of 0.  Date last known well: 02/07/2020 Time last known well: Time: 23:00 tPA Given: No: Resolution of symptoms  Past Medical History:  Diagnosis Date  . History of chicken pox   . History of nephrolithiasis 2012  . Smoker     Past Surgical History:  Procedure Laterality Date  . COLONOSCOPY  12/2019   benign polyp, rpt 10 yrs [facility])  . LITHOTRIPSY  03/2013  . REFRACTIVE SURGERY Bilateral 03/2019   Marina Goodell in [place]    Family History  Problem Relation Age of Onset  . Cancer Paternal Uncle 59       lung (smoker)  . Cancer Paternal Uncle 55       throat  . Emphysema Father        smoker  . Urolithiasis Mother   . CAD Neg Hx   . Stroke Neg Hx   . Diabetes Neg Hx   . Hypertension Neg Hx    Social History:  reports that he has been smoking e-cigarettes. He started smoking about 36 years ago. He has never used smokeless tobacco. He reports that he does not drink alcohol or use drugs.  Allergies: No Known Allergies  Medications:  I have reviewed the patient's current medications. Prior to Admission:  Medications Prior to Admission  Medication Sig Dispense Refill Last Dose  . cholecalciferol (VITAMIN D3) 25 MCG (1000 UNIT) tablet Take 1,000 Units by mouth daily.   02/07/2020 at  Unknown time  . ibuprofen (ADVIL,MOTRIN) 200 MG tablet Take 200 mg by mouth every 6 (six) hours as needed.   prn at prn  . Multiple Vitamins-Minerals (AIRBORNE PO) Take 1 tablet by mouth daily.   02/08/2020 at 0630  . zinc gluconate 50 MG tablet Take 50 mg by mouth daily.   02/07/2020 at Unknown time   Scheduled: . [START ON 02/09/2020] aspirin EC  81 mg Oral Daily  . atorvastatin  40 mg Oral q1800  . cholecalciferol  1,000 Units Oral Daily    ROS: History obtained from the patient  General ROS: negative for - chills, fatigue, fever, night sweats, weight gain or weight loss Psychological ROS: negative for - behavioral disorder, hallucinations, memory difficulties, mood swings or suicidal ideation Ophthalmic ROS: negative for - blurry vision, double vision, eye pain or loss of vision ENT ROS: negative for - epistaxis, nasal discharge, oral lesions, sore throat, tinnitus or vertigo Allergy and Immunology ROS: negative for - hives or itchy/watery eyes Hematological and Lymphatic ROS: negative for - bleeding problems, bruising or swollen lymph nodes Endocrine ROS: negative for - galactorrhea, hair pattern changes, polydipsia/polyuria or temperature intolerance Respiratory ROS: negative for - cough, hemoptysis, shortness of breath or wheezing Cardiovascular ROS: chest pain Gastrointestinal ROS: negative for - abdominal pain, diarrhea, hematemesis, nausea/vomiting or  stool incontinence Genito-Urinary ROS: negative for - dysuria, hematuria, incontinence or urinary frequency/urgency Musculoskeletal ROS: negative for - joint swelling or muscular weakness Neurological ROS: as noted in HPI Dermatological ROS: negative for rash and skin lesion changes  Physical Examination: Blood pressure 97/70, pulse (!) 58, temperature 98.2 F (36.8 C), temperature source Oral, resp. rate 18, height 5\' 10"  (1.778 m), weight 68 kg, SpO2 98 %.  HEENT-  Normocephalic, no lesions, without obvious abnormality.  Normal  external eye and conjunctiva.  Normal TM's bilaterally.  Normal auditory canals and external ears. Normal external nose, mucus membranes and septum.  Normal pharynx. Cardiovascular- S1, S2 normal, pulses palpable throughout   Lungs- chest clear, no wheezing, rales, normal symmetric air entry Abdomen- soft, non-tender; bowel sounds normal; no masses,  no organomegaly Extremities- no edema Lymph-no adenopathy palpable Musculoskeletal-no joint tenderness, deformity or swelling Skin-warm and dry, no hyperpigmentation, vitiligo, or suspicious lesions  Neurological Examination   Mental Status: Alert, oriented, thought content appropriate.  Speech fluent without evidence of aphasia.  Able to follow 3 step commands without difficulty. Cranial Nerves: II: Visual fields grossly normal, pupils equal, round, reactive to light and accommodation III,IV, VI: ptosis not present, extra-ocular motions intact bilaterally V,VII: smile symmetric, facial light touch sensation normal bilaterally VIII: hearing normal bilaterally IX,X: gag reflex present XI: bilateral shoulder shrug XII: midline tongue extension Motor: Right : Upper extremity   5/5    Left:     Upper extremity   5/5  Lower extremity   5/5     Lower extremity   5/5 Tone and bulk:normal tone throughout; no atrophy noted Sensory: Pinprick and light touch intact throughout, bilaterally Deep Tendon Reflexes: Symmetric throughout Plantars: Right: downgoing   Left: downgoing Cerebellar: Normal finger-to-nose and normal heel-to-shin testing bilaterally Gait: not tested due to safety concerns    Laboratory Studies:  Basic Metabolic Panel: Recent Labs  Lab 02/08/20 0856  NA 140  K 4.0  CL 103  CO2 27  GLUCOSE 90  BUN 17  CREATININE 0.87  CALCIUM 9.6    Liver Function Tests: Recent Labs  Lab 02/08/20 0856  AST 18  ALT 15  ALKPHOS 47  BILITOT 0.8  PROT 7.2  ALBUMIN 4.2   Recent Labs  Lab 02/08/20 0856  LIPASE 22   No  results for input(s): AMMONIA in the last 168 hours.  CBC: Recent Labs  Lab 02/08/20 0856  WBC 6.4  NEUTROABS 3.1  HGB 13.9  HCT 40.8  MCV 90.1  PLT 225    Cardiac Enzymes: No results for input(s): CKTOTAL, CKMB, CKMBINDEX, TROPONINI in the last 168 hours.  BNP: Invalid input(s): POCBNP  CBG: No results for input(s): GLUCAP in the last 168 hours.  Microbiology: Results for orders placed or performed during the hospital encounter of 02/08/20  Respiratory Panel by RT PCR (Flu A&B, Covid) - Nasopharyngeal Swab     Status: None   Collection Time: 02/08/20 10:19 AM   Specimen: Nasopharyngeal Swab  Result Value Ref Range Status   SARS Coronavirus 2 by RT PCR NEGATIVE NEGATIVE Final    Comment: (NOTE) SARS-CoV-2 target nucleic acids are NOT DETECTED. The SARS-CoV-2 RNA is generally detectable in upper respiratoy specimens during the acute phase of infection. The lowest concentration of SARS-CoV-2 viral copies this assay can detect is 131 copies/mL. A negative result does not preclude SARS-Cov-2 infection and should not be used as the sole basis for treatment or other patient management decisions. A negative result may occur with  improper specimen collection/handling, submission of specimen other than nasopharyngeal swab, presence of viral mutation(s) within the areas targeted by this assay, and inadequate number of viral copies (<131 copies/mL). A negative result must be combined with clinical observations, patient history, and epidemiological information. The expected result is Negative. Fact Sheet for Patients:  https://www.moore.com/ Fact Sheet for Healthcare Providers:  https://www.young.biz/ This test is not yet ap proved or cleared by the [place] FDA and  has been authorized for detection and/or diagnosis of SARS-CoV-2 by FDA under an Emergency Use Authorization (EUA). This EUA will remain  in effect (meaning this test can  be used) for the duration of the COVID-19 declaration under Section 564(b)(1) of the Act, 21 U.S.C. section 360bbb-3(b)(1), unless the authorization is terminated or revoked sooner.    Influenza A by PCR NEGATIVE NEGATIVE Final   Influenza B by PCR NEGATIVE NEGATIVE Final    Comment: (NOTE) The Xpert Xpress SARS-CoV-2/FLU/RSV assay is intended as an aid in  the diagnosis of influenza from Nasopharyngeal swab specimens and  should not be used as a sole basis for treatment. Nasal washings and  aspirates are unacceptable for Xpert Xpress SARS-CoV-2/FLU/RSV  testing. Fact Sheet for Patients: https://www.moore.com/ Fact Sheet for Healthcare Providers: https://www.young.biz/ This test is not yet approved or cleared by the [place] FDA and  has been authorized for detection and/or diagnosis of SARS-CoV-2 by  FDA under an Emergency Use Authorization (EUA). This EUA will remain  in effect (meaning this test can be used) for the duration of the  Covid-19 declaration under Section 564(b)(1) of the Act, 21  U.S.C. section 360bbb-3(b)(1), unless the authorization is  terminated or revoked. Performed at [facility], [address] [place], [place] [address]     Coagulation Studies: Recent Labs    02/08/20 0856  LABPROT 12.1  INR 0.9    Urinalysis:  Recent Labs  Lab 02/08/20 0856  COLORURINE AMBER*  LABSPEC 1.019  PHURINE 5.0  GLUCOSEU NEGATIVE  HGBUR NEGATIVE  BILIRUBINUR NEGATIVE  KETONESUR NEGATIVE  PROTEINUR NEGATIVE  NITRITE NEGATIVE  LEUKOCYTESUR NEGATIVE    Lipid Panel:    Component Value Date/Time   CHOL 202 (H) 08/26/2019 0931   TRIG 156.0 (H) 08/26/2019 0931   HDL 47.40 08/26/2019 0931   CHOLHDL 4 08/26/2019 0931   VLDL 31.2 08/26/2019 0931   LDLCALC 124 (H) 08/26/2019 0931    HgbA1C: No results found for: HGBA1C  Urine Drug Screen:  No results found for: LABOPIA, COCAINSCRNUR, LABBENZ, AMPHETMU, THCU,  LABBARB  Alcohol Level: No results for input(s): ETH in the last 168 hours.  Other results: EKG: sinus rhythm at 61 bpm.  Imaging: CT Head Wo Contrast  Result Date: 02/08/2020 CLINICAL DATA:  TIA.  Left side numbness EXAM: CT HEAD WITHOUT CONTRAST TECHNIQUE: Contiguous axial images were obtained from the base of the skull through the vertex without intravenous contrast. COMPARISON:  None. FINDINGS: Brain: No acute intracranial abnormality. Specifically, no hemorrhage, hydrocephalus, mass lesion, acute infarction, or significant intracranial injury. Vascular: No hyperdense vessel or unexpected calcification. Skull: No acute calvarial abnormality. Sinuses/Orbits: Visualized paranasal sinuses and mastoids clear. Orbital soft tissues unremarkable. Other: None IMPRESSION: Normal study. Electronically Signed   By: Charlett Nose M.D.   On: 02/08/2020 09:51   DG Chest Portable 1 View  Result Date: 02/08/2020 CLINICAL DATA:  Episode of chest pain this morning. EXAM: PORTABLE CHEST 1 VIEW COMPARISON:  None. FINDINGS: The cardiac silhouette, mediastinal and hilar contours are normal. The lungs are  clear. No pleural effusion or pneumothorax. No pulmonary lesions. The bony thorax is intact. IMPRESSION: No acute cardiopulmonary findings. Electronically Signed   By: Marijo Sanes M.D.   On: 02/08/2020 09:23    Assessment: 54 y.o. male presenting with complaints of a short episode of left arm and face numbness that resolved spontaneously and has not recurred.  Today with chest pain as well.  Concern is for TIA.  Patient a smoker but with no other vascular risk factors.  Further work up recommended.  Stroke Risk Factors - smoking  Plan: 1. HgbA1c, fasting lipid panel 2. MRI, MRA  of the brain without contrast 3. PT consult, OT consult, Speech consult 4. Echocardiogram 5. Carotid dopplers 6. Prophylactic therapy-ASA 81mg  daily 7. NPO until RN stroke swallow screen 8. Telemetry monitoring 9. Frequent neuro  checks 10. If MRI of the brain unremarkable patient to have MRI of the cervical spine.     Alexis Goodell, MD Neurology [phone] 02/08/2020, 1:17 PM

## 2020-02-08 NOTE — ED Notes (Signed)
ED TO INPATIENT HANDOFF REPORT  ED Nurse Name and Phone #: Geraldine Contras 16  S Name/Age/Gender Derrick Young 54 y.o. male Room/Bed: ED17A/ED17A  Code Status   Code Status: Full Code  Home/SNF/Other Home Patient oriented to: self, place, time and situation Is this baseline? Yes   Triage Complete: Triage complete  Chief Complaint STEMI (ST elevation myocardial infarction) (HCC) [I21.3]  Triage Note Pt states last night around 11-11:30pm he had sudden onset left sided numbness in face, arm and leg lasting less then an hour, states it went away and he went to bed, this morning denies any numbness but is having discomfort in his chest. Denies SOb, nausea or diaphoresis. Pt is in NAD on arrival.    Allergies No Known Allergies  Level of Care/Admitting Diagnosis ED Disposition    ED Disposition Condition Comment   Admit  Hospital Area: [facility] [[id number]]  Level of Care: Telemetry [5]  Covid Evaluation: Asymptomatic Screening Protocol (No Symptoms)  Diagnosis: STEMI (ST elevation myocardial infarction) [facility]) [[id number]]  Admitting Physician: Lonia Mad  Attending Physician: Lonia Mad  Estimated length of stay: past midnight tomorrow  Certification:: I certify this patient will need inpatient services for at least 2 midnights       B Medical/Surgery History Past Medical History:  Diagnosis Date  . History of chicken pox   . History of nephrolithiasis 2012  . Smoker    Past Surgical History:  Procedure Laterality Date  . COLONOSCOPY  12/2019   benign polyp, rpt 10 yrs [facility])  . LITHOTRIPSY  03/2013  . REFRACTIVE SURGERY Bilateral 03/2019   Marina Goodell in [place]     A IV Location/Drains/Wounds Patient Lines/Drains/Airways Status   Active Line/Drains/Airways    Name:   Placement date:   Placement time:   Site:   Days:   Peripheral IV 02/08/20 Left Antecubital   02/08/20    0857    Antecubital   less than 1   Peripheral  IV 02/08/20 Left Forearm   02/08/20    1009    Forearm   less than 1          Intake/Output Last 24 hours No intake or output data in the 24 hours ending 02/08/20 1122  Labs/Imaging Results for orders placed or performed during the hospital encounter of 02/08/20 (from the past 48 hour(s))  CBC with Differential     Status: None   Collection Time: 02/08/20  8:56 AM  Result Value Ref Range   WBC 6.4 4.0 - 10.5 K/uL   RBC 4.53 4.22 - 5.81 MIL/uL   Hemoglobin 13.9 13.0 - 17.0 g/dL   HCT 85.2 77.8 - 24.2 %   MCV 90.1 80.0 - 100.0 fL   MCH 30.7 26.0 - 34.0 pg   MCHC 34.1 30.0 - 36.0 g/dL   RDW 35.3 61.4 - 43.1 %   Platelets 225 150 - 400 K/uL   nRBC 0.0 0.0 - 0.2 %   Neutrophils Relative % 47 %   Neutro Abs 3.1 1.7 - 7.7 K/uL   Lymphocytes Relative 41 %   Lymphs Abs 2.6 0.7 - 4.0 K/uL   Monocytes Relative 10 %   Monocytes Absolute 0.6 0.1 - 1.0 K/uL   Eosinophils Relative 1 %   Eosinophils Absolute 0.1 0.0 - 0.5 K/uL   Basophils Relative 1 %   Basophils Absolute 0.0 0.0 - 0.1 K/uL   Immature Granulocytes 0 %   Abs Immature Granulocytes 0.02 0.00 - 0.07 K/uL  Comment: Performed at [facility], [address] [place], [place] [address]  Comprehensive metabolic panel     Status: None   Collection Time: 02/08/20  8:56 AM  Result Value Ref Range   Sodium 140 135 - 145 mmol/L   Potassium 4.0 3.5 - 5.1 mmol/L   Chloride 103 98 - 111 mmol/L   CO2 27 22 - 32 mmol/L   Glucose, Bld 90 70 - 99 mg/dL    Comment: Glucose reference range applies only to samples taken after fasting for at least 8 hours.   BUN 17 6 - 20 mg/dL   Creatinine, Ser 6.31 0.61 - 1.24 mg/dL   Calcium 9.6 8.9 - 49.7 mg/dL   Total Protein 7.2 6.5 - 8.1 g/dL   Albumin 4.2 3.5 - 5.0 g/dL   AST 18 15 - 41 U/L   ALT 15 0 - 44 U/L   Alkaline Phosphatase 47 38 - 126 U/L   Total Bilirubin 0.8 0.3 - 1.2 mg/dL   GFR calc non Af Amer >60 >60 mL/min   GFR calc Af Amer >60 >60 mL/min   Anion gap 10 5 - 15     Comment: Performed at [facility], [address] [place], [place] [address]  Lipase, blood     Status: None   Collection Time: 02/08/20  8:56 AM  Result Value Ref Range   Lipase 22 11 - 51 U/L    Comment: Performed at [facility], [address] [place], [place] [address]  Troponin I (High Sensitivity)     Status: None   Collection Time: 02/08/20  8:56 AM  Result Value Ref Range   Troponin I (High Sensitivity) <2 <18 ng/L    Comment: (NOTE) Elevated high sensitivity troponin I (hsTnI) values and significant  changes across serial measurements may suggest ACS but many other  chronic and acute conditions are known to elevate hsTnI results.  Refer to the "Links" section for chest pain algorithms and additional  guidance. Performed at [facility], [address] [place], [place] [address]   Brain natriuretic peptide     Status: None   Collection Time: 02/08/20  8:56 AM  Result Value Ref Range   B Natriuretic Peptide 14.0 0.0 - 100.0 pg/mL    Comment: Performed at [facility], [address] [place], [place] [address]  Urinalysis, Routine w reflex microscopic     Status: Abnormal   Collection Time: 02/08/20  8:56 AM  Result Value Ref Range   Color, Urine AMBER (A) YELLOW    Comment: BIOCHEMICALS MAY BE AFFECTED BY COLOR   APPearance HAZY (A) CLEAR   Specific Gravity, Urine 1.019 1.005 - 1.030   pH 5.0 5.0 - 8.0   Glucose, UA NEGATIVE NEGATIVE mg/dL   Hgb urine dipstick NEGATIVE NEGATIVE   Bilirubin Urine NEGATIVE NEGATIVE   Ketones, ur NEGATIVE NEGATIVE mg/dL   Protein, ur NEGATIVE NEGATIVE mg/dL   Nitrite NEGATIVE NEGATIVE   Leukocytes,Ua NEGATIVE NEGATIVE    Comment: Performed at [facility], [address] [place], [place] [address]  Protime-INR     Status: None   Collection Time: 02/08/20  8:56 AM  Result Value Ref Range   Prothrombin Time 12.1 11.4 - 15.2 seconds   INR 0.9 0.8 - 1.2    Comment: (NOTE) INR  goal varies based on device and disease states. Performed at [facility], [address] [place], [place] [address]   APTT  Status: None   Collection Time: 02/08/20  8:56 AM  Result Value Ref Range   aPTT 28 24 - 36 seconds    Comment: Performed at [facility], [address] [place], [place] [address]   CT Head Wo Contrast  Result Date: 02/08/2020 CLINICAL DATA:  TIA.  Left side numbness EXAM: CT HEAD WITHOUT CONTRAST TECHNIQUE: Contiguous axial images were obtained from the base of the skull through the vertex without intravenous contrast. COMPARISON:  None. FINDINGS: Brain: No acute intracranial abnormality. Specifically, no hemorrhage, hydrocephalus, mass lesion, acute infarction, or significant intracranial injury. Vascular: No hyperdense vessel or unexpected calcification. Skull: No acute calvarial abnormality. Sinuses/Orbits: Visualized paranasal sinuses and mastoids clear. Orbital soft tissues unremarkable. Other: None IMPRESSION: Normal study. Electronically Signed   By: Charlett Nose M.D.   On: 02/08/2020 09:51   DG Chest Portable 1 View  Result Date: 02/08/2020 CLINICAL DATA:  Episode of chest pain this morning. EXAM: PORTABLE CHEST 1 VIEW COMPARISON:  None. FINDINGS: The cardiac silhouette, mediastinal and hilar contours are normal. The lungs are clear. No pleural effusion or pneumothorax. No pulmonary lesions. The bony thorax is intact. IMPRESSION: No acute cardiopulmonary findings. Electronically Signed   By: Rudie Meyer M.D.   On: 02/08/2020 09:23    Pending Labs Unresulted Labs (From admission, onward)    Start     Ordered   02/09/20 0500  Basic metabolic panel  Tomorrow morning,   STAT     02/08/20 1051   02/09/20 0500  Lipid panel  Tomorrow morning,   STAT     02/08/20 1051   02/09/20 0500  CBC  Tomorrow morning,   STAT     02/08/20 1051   02/08/20 1630  Heparin level (unfractionated)  Once-Timed,   STAT     02/08/20 1029   02/08/20 1046  HIV  Antibody (routine testing w rflx)  (HIV Antibody (Routine testing w reflex) panel)  Once,   STAT     02/08/20 1051   02/08/20 0932  Respiratory Panel by RT PCR (Flu A&B, Covid) - Nasopharyngeal Swab  (Tier 2 Respiratory Panel by RT PCR (Flu A&B, Covid) (TAT 2 hrs))  Once,   STAT    Question Answer Comment  Is this test for diagnosis or screening Screening   Symptomatic for COVID-19 as defined by CDC No   Hospitalized for COVID-19 No   Admitted to ICU for COVID-19 No   Previously tested for COVID-19 No   Resident in a congregate (group) care setting No   Employed in healthcare setting No      02/08/20 0932          Vitals/Pain Today's Vitals   02/08/20 1021 02/08/20 1030 02/08/20 1045 02/08/20 1100  BP: 95/69 103/73  97/67  Pulse:  (!) 58 (!) 56 (!) 56  Resp:  16 14 16   Temp:      TempSrc:      SpO2:  97% 98% 98%  Weight:      Height:      PainSc:        Isolation Precautions No active isolations  Medications Medications  heparin bolus via infusion 4,000 Units (4,000 Units Intravenous Bolus from Bag 02/08/20 1011)    Followed by  heparin ADULT infusion 100 units/mL (25000 units/227mL sodium chloride 0.45%) (800 Units/hr Intravenous New Bag/Given 02/08/20 1012)  cholecalciferol (VITAMIN D3) tablet 1,000 Units (has no administration in time range)  aspirin EC tablet 81 mg (has no administration in time  range)  nitroGLYCERIN (NITROSTAT) SL tablet 0.4 mg (has no administration in time range)  acetaminophen (TYLENOL) tablet 650 mg (has no administration in time range)  ondansetron (ZOFRAN) injection 4 mg (has no administration in time range)  atorvastatin (LIPITOR) tablet 40 mg (has no administration in time range)  metoprolol tartrate (LOPRESSOR) tablet 12.5 mg (has no administration in time range)  aspirin chewable tablet 324 mg (324 mg Oral Given 02/08/20 1006)    Mobility walks Low fall risk   Focused Assessments    R Recommendations: See Admitting Provider  Note  Report given to:

## 2020-02-08 NOTE — H&P (Signed)
History and Physical    Derrick Young BZJ:[id number] DOB: October 19, 1966 DOA: 02/08/2020  PCP: [name], MD   Patient coming from: Home  I have personally briefly reviewed patient's old medical records in [facility]  Chief Complaint: Left arm numbness                                Chest Discomfort  HPI: Derrick Young is a 54 y.o. male with no significant past medical history presented to the emergency room with complaints of arm numbness with radiation to the left side of his face which occurred 24 hours prior to presenting to the emergency room.  On the day of admission developed chest pressure over the left anterior chest wall.  Chest pressure was nonradiating and he denied having any associated symptoms such as shortness of breath, nausea, vomiting, diaphoresis or palpitations.  Chest discomfort lasted about 3 hours and at the time of my examination, patient is chest pain-free.  He does not have a family history of strokes or cardiac disease. He is a reformed smoker but uses Vape at this time.  ED Course: Patient was evaluated in the ER twelve-lead EKG showed 60mm ST elevation in the inferior leads.  ER provider discussed with Cardiologist Dr Juliann Pares who did not think it was necessary to activate the Cath Lab but recommended IV heparin.  Review of Systems: As per HPI otherwise 10 point review of systems negative.    Past Medical History:  Diagnosis Date  . History of chicken pox   . History of nephrolithiasis 2012  . Smoker     Past Surgical History:  Procedure Laterality Date  . COLONOSCOPY  12/2019   benign polyp, rpt 10 yrs [facility])  . LITHOTRIPSY  03/2013  . REFRACTIVE SURGERY Bilateral 03/2019   Marina Goodell in [place]     reports that he has been smoking e-cigarettes. He started smoking about 36 years ago. He has never used smokeless tobacco. He reports that he does not drink alcohol or use drugs.  No Known Allergies  Family History  Problem Relation Age of  Onset  . Cancer Paternal Uncle 19       lung (smoker)  . Cancer Paternal Uncle 55       throat  . Emphysema Father        smoker  . Urolithiasis Mother   . CAD Neg Hx   . Stroke Neg Hx   . Diabetes Neg Hx   . Hypertension Neg Hx      Prior to Admission medications   Medication Sig Start Date End Date Taking? Authorizing Provider  cholecalciferol (VITAMIN D3) 25 MCG (1000 UNIT) tablet Take 1,000 Units by mouth daily.   Yes [provider]  ibuprofen (ADVIL,MOTRIN) 200 MG tablet Take 200 mg by mouth every 6 (six) hours as needed.   Yes [provider]  Multiple Vitamins-Minerals (AIRBORNE PO) Take 1 tablet by mouth daily.   Yes [provider]  zinc gluconate 50 MG tablet Take 50 mg by mouth daily.   Yes [provider]    Physical Exam: Vitals:   02/08/20 1045 02/08/20 1100 02/08/20 1130 02/08/20 1200  BP:  97/67 99/75 97/70   Pulse: (!) 56 (!) 56 (!) 57 (!) 58  Resp: 14 16 18 18   Temp:    98.2 F (36.8 C)  TempSrc:    Oral  SpO2: 98% 98% 97% 98%  Weight:  Height:         Vitals:   02/08/20 1045 02/08/20 1100 02/08/20 1130 02/08/20 1200  BP:  97/67 99/75 97/70   Pulse: (!) 56 (!) 56 (!) 57 (!) 58  Resp: 14 16 18 18   Temp:    98.2 F (36.8 C)  TempSrc:    Oral  SpO2: 98% 98% 97% 98%  Weight:      Height:        Constitutional: NAD, alert and oriented to person place and time Eyes: PERRL, lids and conjunctivae normal ENMT: Mucous membranes are moist.  Neck: normal, supple, no masses, no thyromegaly Respiratory: clear to auscultation bilaterally, no wheezing, no crackles. Normal respiratory effort. No accessory muscle use.  Cardiovascular: Regular rate and rhythm, no murmurs / rubs / gallops. No extremity edema. 2+ pedal pulses. No carotid bruits.  Bradycardic Abdomen: no tenderness, no masses palpated. No hepatosplenomegaly. Bowel sounds positive.  Musculoskeletal: no clubbing / cyanosis. No joint deformity upper and lower  extremities.  Skin: no rashes, lesions, ulcers.  Neurologic: No gross focal neurologic deficit. Able to move all extremities Psychiatric: Normal mood and affect.   Labs on Admission: I have personally reviewed following labs and imaging studies  CBC: Recent Labs  Lab 02/08/20 0856  WBC 6.4  NEUTROABS 3.1  HGB 13.9  HCT 40.8  MCV 90.1  PLT 225   Basic Metabolic Panel: Recent Labs  Lab 02/08/20 0856  NA 140  K 4.0  CL 103  CO2 27  GLUCOSE 90  BUN 17  CREATININE 0.87  CALCIUM 9.6   GFR: Estimated Creatinine Clearance: 95.5 mL/min (by C-G formula based on SCr of 0.87 mg/dL). Liver Function Tests: Recent Labs  Lab 02/08/20 0856  AST 18  ALT 15  ALKPHOS 47  BILITOT 0.8  PROT 7.2  ALBUMIN 4.2   Recent Labs  Lab 02/08/20 0856  LIPASE 22   No results for input(s): AMMONIA in the last 168 hours. Coagulation Profile: Recent Labs  Lab 02/08/20 0856  INR 0.9   Cardiac Enzymes: No results for input(s): CKTOTAL, CKMB, CKMBINDEX, TROPONINI in the last 168 hours. BNP (last 3 results) No results for input(s): PROBNP in the last 8760 hours. HbA1C: No results for input(s): HGBA1C in the last 72 hours. CBG: No results for input(s): GLUCAP in the last 168 hours. Lipid Profile: No results for input(s): CHOL, HDL, LDLCALC, TRIG, CHOLHDL, LDLDIRECT in the last 72 hours. Thyroid Function Tests: No results for input(s): TSH, T4TOTAL, FREET4, T3FREE, THYROIDAB in the last 72 hours. Anemia Panel: No results for input(s): VITAMINB12, FOLATE, FERRITIN, TIBC, IRON, RETICCTPCT in the last 72 hours. Urine analysis:    Component Value Date/Time   COLORURINE AMBER (A) 02/08/2020 0856   APPEARANCEUR HAZY (A) 02/08/2020 0856   LABSPEC 1.019 02/08/2020 0856   PHURINE 5.0 02/08/2020 0856   GLUCOSEU NEGATIVE 02/08/2020 0856   HGBUR NEGATIVE 02/08/2020 0856   BILIRUBINUR NEGATIVE 02/08/2020 0856   BILIRUBINUR negative 07/05/2018 0855   KETONESUR NEGATIVE 02/08/2020 0856    PROTEINUR NEGATIVE 02/08/2020 0856   UROBILINOGEN 0.2 07/05/2018 0855   NITRITE NEGATIVE 02/08/2020 0856   LEUKOCYTESUR NEGATIVE 02/08/2020 0856    Radiological Exams on Admission: CT Head Wo Contrast  Result Date: 02/08/2020 CLINICAL DATA:  TIA.  Left side numbness EXAM: CT HEAD WITHOUT CONTRAST TECHNIQUE: Contiguous axial images were obtained from the base of the skull through the vertex without intravenous contrast. COMPARISON:  None. FINDINGS: Brain: No acute intracranial abnormality. Specifically, no hemorrhage, hydrocephalus, mass  lesion, acute infarction, or significant intracranial injury. Vascular: No hyperdense vessel or unexpected calcification. Skull: No acute calvarial abnormality. Sinuses/Orbits: Visualized paranasal sinuses and mastoids clear. Orbital soft tissues unremarkable. Other: None IMPRESSION: Normal study. Electronically Signed   By: Rolm Baptise M.D.   On: 02/08/2020 09:51   DG Chest Portable 1 View  Result Date: 02/08/2020 CLINICAL DATA:  Episode of chest pain this morning. EXAM: PORTABLE CHEST 1 VIEW COMPARISON:  None. FINDINGS: The cardiac silhouette, mediastinal and hilar contours are normal. The lungs are clear. No pleural effusion or pneumothorax. No pulmonary lesions. The bony thorax is intact. IMPRESSION: No acute cardiopulmonary findings. Electronically Signed   By: Marijo Sanes M.D.   On: 02/08/2020 09:23    EKG: Independently reviewed.  52mm ST-segment elevation  Assessment/Plan Principal Problem:   STEMI (ST elevation myocardial infarction) ([place]) Active Problems:   TIA (transient ischemic attack)   STEMI Patient with no significant past medical history who presents to the ED for evaluation of left arm numbness with radiation to the face and associated chest discomfort.  Twelve-lead EKG showed 86mm ST segment elevation.  Patient is currently chest pain-free and initial troponin was within normal limits Will obtain serial cardiac enzymes Place patient on  Aspirin 81mg  daily as well as stains Hold Beta blockers due to bradycardia Patient is currently on IV heparin Will request cardiology consult    Possible TIA Patient presented with left sided numbness with radiation to his face.  This has resolved Initial CT scan of the head without contrast is negative for bleed Obtain MRI of the brain without contrast to rule out an acute infarct We will request neurology consult  DVT prophylaxis: IV Heparin Code Status: Full Code Family Communication: Plan of care was discussed with patient in detail. He verbalizes understanding and agrees with the plan Disposition Plan: Back to previous home environment Consults called: Cardiology and Neurology    Collier Bullock MD [facility]     02/08/2020, 12:36 PM

## 2020-02-08 NOTE — ED Triage Notes (Signed)
Pt states last night around 11-11:30pm he had sudden onset left sided numbness in face, arm and leg lasting less then an hour, states it went away and he went to bed, this morning denies any numbness but is having discomfort in his chest. Denies SOb, nausea or diaphoresis. Pt is in NAD on arrival.

## 2020-02-08 NOTE — ED Notes (Signed)
Pt on the phone with MRI for screening

## 2020-02-08 NOTE — Consult Note (Signed)
CARDIOLOGY CONSULT NOTE               Patient ID: Derrick Young MRN: [id number] DOB/AGE: 1966/01/08 54 y.o.  Admit date: 02/08/2020 Referring Physician Dr Joylene Igo hospitalist Primary Physician [name] MD primary Primary Cardiologist [facility] Reason for Consultation abnormal EKG chest pain  HPI: Patient is a 54 year old male presents with left-sided arm numbness radiating into the neck and jaw denies any weakness complains of vague pain symptoms intermittent and recurrent.  He had multiple episodes over the last 24 hours and finally came to the emergency room for evaluation.  Patient was concerned that he had some aphasia motion the left side of his face which also only lasted for short time denies any palpitations tachycardia he smokes but has not had any previous arteriosclerotic vascular disease.  On presentation to the emergency room he was found to have a slightly abnormal EKG that did not meet criteria for STEMI there was no old EKG to compare.  Troponin was negative.  Patient has had improvement in chest discomfort and left arm discomfort was admitted for further evaluation to telemetry for further assessment is also to undergo neurology evaluation for possible TIA.  Currently the patient is asymptomatic.  Denies any blackout spells syncope no nausea vomiting no fever chills or sweats has not had Covid.  Review of systems complete and found to be negative unless listed above     Past Medical History:  Diagnosis Date  . History of chicken pox   . History of nephrolithiasis 2012  . Smoker     Past Surgical History:  Procedure Laterality Date  . COLONOSCOPY  12/2019   benign polyp, rpt 10 yrs [facility])  . LITHOTRIPSY  03/2013  . REFRACTIVE SURGERY Bilateral 03/2019   Marina Goodell in [place]    Medications Prior to Admission  Medication Sig Dispense Refill Last Dose  . cholecalciferol (VITAMIN D3) 25 MCG (1000 UNIT) tablet Take 1,000 Units by mouth daily.   02/07/2020 at  Unknown time  . ibuprofen (ADVIL,MOTRIN) 200 MG tablet Take 200 mg by mouth every 6 (six) hours as needed.   prn at prn  . Multiple Vitamins-Minerals (AIRBORNE PO) Take 1 tablet by mouth daily.   02/08/2020 at 0630  . zinc gluconate 50 MG tablet Take 50 mg by mouth daily.   02/07/2020 at Unknown time   Social History   Socioeconomic History  . Marital status: Married    Spouse name: Not on file  . Number of children: Not on file  . Years of education: Not on file  . Highest education level: Not on file  Occupational History  . Not on file  Tobacco Use  . Smoking status: Current Some Day Smoker    Types: E-cigarettes    Start date: 12/09/1983  . Smokeless tobacco: Never Used  . Tobacco comment: quit smoking cigarettes/cigars 11/2017  Substance and Sexual Activity  . Alcohol use: No  . Drug use: No  . Sexual activity: Not on file  Other Topics Concern  . Not on file  Social History Narrative   "[place]"   From [place]   Caffeine: 4-6 cups coffee/day   Lives with wife, 3 children, 1 cat   Occupation: Personal assistant, [employer]   Edu: PhD   Activity: soccer   Diet: seldom water, fruits/vegetables daily, seldom fast food, fish occasional   Social Determinants of Health   Financial Resource Strain:   . Difficulty of Paying Living Expenses: Not on file  Food Insecurity:   .  Worried About Charity fundraiser in the Last Year: Not on file  . Ran Out of Food in the Last Year: Not on file  Transportation Needs:   . Lack of Transportation (Medical): Not on file  . Lack of Transportation (Non-Medical): Not on file  Physical Activity:   . Days of Exercise per Week: Not on file  . Minutes of Exercise per Session: Not on file  Stress:   . Feeling of Stress : Not on file  Social Connections:   . Frequency of Communication with Friends and Family: Not on file  . Frequency of Social Gatherings with Friends and Family: Not on file  . Attends Religious Services: Not on file  . Active Member  of Clubs or Organizations: Not on file  . Attends Archivist Meetings: Not on file  . Marital Status: Not on file  Intimate Partner Violence:   . Fear of Current or Ex-Partner: Not on file  . Emotionally Abused: Not on file  . Physically Abused: Not on file  . Sexually Abused: Not on file    Family History  Problem Relation Age of Onset  . Cancer Paternal Uncle 66       lung (smoker)  . Cancer Paternal Uncle 55       throat  . Emphysema Father        smoker  . Urolithiasis Mother   . CAD Neg Hx   . Stroke Neg Hx   . Diabetes Neg Hx   . Hypertension Neg Hx       Review of systems complete and found to be negative unless listed above      PHYSICAL EXAM  General: Well developed, well nourished, in no acute distress HEENT:  Normocephalic and atramatic Neck:  No JVD.  Lungs: Clear bilaterally to auscultation and percussion. Heart: HRRR . Normal S1 and S2 without gallops or murmurs.  Abdomen: Bowel sounds are positive, abdomen soft and non-tender  Msk:  Back normal, normal gait. Normal strength and tone for age. Extremities: No clubbing, cyanosis or edema.   Neuro: Alert and oriented X 3. Psych:  Good affect, responds appropriately  Labs:   Lab Results  Component Value Date   WBC 6.4 02/08/2020   HGB 13.9 02/08/2020   HCT 40.8 02/08/2020   MCV 90.1 02/08/2020   PLT 225 02/08/2020    Recent Labs  Lab 02/08/20 0856  NA 140  K 4.0  CL 103  CO2 27  BUN 17  CREATININE 0.87  CALCIUM 9.6  PROT 7.2  BILITOT 0.8  ALKPHOS 47  ALT 15  AST 18  GLUCOSE 90   No results found for: CKTOTAL, CKMB, CKMBINDEX, TROPONINI  Lab Results  Component Value Date   CHOL 202 (H) 08/26/2019   CHOL 201 (H) 01/05/2018   CHOL 170 10/01/2015   Lab Results  Component Value Date   HDL 47.40 08/26/2019   HDL 52.00 01/05/2018   HDL 44.10 10/01/2015   Lab Results  Component Value Date   LDLCALC 124 (H) 08/26/2019   LDLCALC 117 (H) 01/05/2018   LDLCALC 114 (H)  10/01/2015   Lab Results  Component Value Date   TRIG 156.0 (H) 08/26/2019   TRIG 162.0 (H) 01/05/2018   TRIG 60.0 10/01/2015   Lab Results  Component Value Date   CHOLHDL 4 08/26/2019   CHOLHDL 4 01/05/2018   CHOLHDL 4 10/01/2015   No results found for: LDLDIRECT    Radiology: CT Head Wo Contrast  Result Date: 02/08/2020 CLINICAL DATA:  TIA.  Left side numbness EXAM: CT HEAD WITHOUT CONTRAST TECHNIQUE: Contiguous axial images were obtained from the base of the skull through the vertex without intravenous contrast. COMPARISON:  None. FINDINGS: Brain: No acute intracranial abnormality. Specifically, no hemorrhage, hydrocephalus, mass lesion, acute infarction, or significant intracranial injury. Vascular: No hyperdense vessel or unexpected calcification. Skull: No acute calvarial abnormality. Sinuses/Orbits: Visualized paranasal sinuses and mastoids clear. Orbital soft tissues unremarkable. Other: None IMPRESSION: Normal study. Electronically Signed   By: Charlett Nose M.D.   On: 02/08/2020 09:51   MR BRAIN WO CONTRAST  Result Date: 02/08/2020 CLINICAL DATA:  54 year old male with sudden onset left side numbness last night lasting about an hour. EXAM: MRI HEAD WITHOUT CONTRAST TECHNIQUE: Multiplanar, multiecho pulse sequences of the brain and surrounding structures were obtained without intravenous contrast. COMPARISON:  Head CT this morning. FINDINGS: Brain: No restricted diffusion to suggest acute infarction. No midline shift, mass effect, evidence of mass lesion, ventriculomegaly, extra-axial collection or acute intracranial hemorrhage. Cervicomedullary junction and pituitary are within normal limits. Normal cerebral volume. Wallace Cullens and white matter signal is within normal limits for age throughout the brain. No encephalomalacia or chronic cerebral blood products identified. Vascular: Major intracranial vascular flow voids are preserved. Skull and upper cervical spine: Normal visible cervical spine.  Normal bone marrow signal. Sinuses/Orbits: Negative. Other: Paranasal sinuses and mastoids are clear. Visible internal auditory structures appear normal. Scalp and face soft tissues appear negative. IMPRESSION: Normal noncontrast MRI appearance of the brain. Electronically Signed   By: Odessa Fleming M.D.   On: 02/08/2020 14:16   DG Chest Portable 1 View  Result Date: 02/08/2020 CLINICAL DATA:  Episode of chest pain this morning. EXAM: PORTABLE CHEST 1 VIEW COMPARISON:  None. FINDINGS: The cardiac silhouette, mediastinal and hilar contours are normal. The lungs are clear. No pleural effusion or pneumothorax. No pulmonary lesions. The bony thorax is intact. IMPRESSION: No acute cardiopulmonary findings. Electronically Signed   By: Rudie Meyer M.D.   On: 02/08/2020 09:23    EKG: Normal sinus rhythm right bundle branch block J-point elevation does not meet STEMI criteria no old EKG to compare this may be a benign EKG for this patient  ASSESSMENT AND PLAN:  Chest pain Possible TIA Evaluate for angina Abnormal EKG Smoking EKG does not meet criteria for STEMI . Plan Agree with admit to telemetry follow-up EKGs and troponins Echocardiogram will be helpful for assessment of left ventricular function and any wall motion abnormality Agree with neurology evaluation for possible TIA Recommend beta-blockade therapy ACE inhibitor aspirin short-term until work-up complete Continue intravenous heparin therapy for 24 to 48 hours Recommend functional study exercise Myoview for evaluation of possible ischemia I recommend conservative cardiac input at this point   Signed: [name] MD, PHD, [facility] 02/08/2020, 7:04 PM

## 2020-02-08 NOTE — ED Notes (Signed)
SR noted on the monitor

## 2020-02-08 NOTE — Consult Note (Addendum)
ANTICOAGULATION CONSULT NOTE  Pharmacy Consult for Heparin Infusion  Indication: chest pain/ACS  No Known Allergies  Patient Measurements: Height: 5\' 10"  (177.8 cm) Weight: 150 lb (68 kg) IBW/kg (Calculated) : 73  Vital Signs: Temp: 98.2 F (36.8 C) (03/03 1608) Temp Source: Oral (03/03 1200) BP: 92/62 (03/03 1608) Pulse Rate: 54 (03/03 1608)  Labs: Recent Labs    02/08/20 0856 02/08/20 1113 02/08/20 1228 02/08/20 1632  HGB 13.9  --   --   --   HCT 40.8  --   --   --   PLT 225  --   --   --   APTT 28  --   --   --   LABPROT 12.1  --   --   --   INR 0.9  --   --   --   HEPARINUNFRC  --   --   --  0.37  CREATININE 0.87  --   --   --   TROPONINIHS <2 <2 <2  --     Estimated Creatinine Clearance: 95.5 mL/min (by C-G formula based on SCr of 0.87 mg/dL).   Medical History: Past Medical History:  Diagnosis Date  . History of chicken pox   . History of nephrolithiasis 2012  . Smoker     Assessment: Pharmacy consulted for heparin infusion dosing and monitoring for 54 yo male admitted with ACS/STEMI.   Goal of Therapy:  Heparin level 0.3-0.7 units/ml Monitor platelets by anticoagulation protocol: Yes   Plan:  3/3 1632 HL 0.37 therapeutic x 1. Continue heparin drip at 800 units/hr. HL at 2300 to confirm. CBC daily while on heparin drip.  44, PharmD Clinical Pharmacist 02/08/2020 5:57 PM

## 2020-02-09 ENCOUNTER — Inpatient Hospital Stay: Payer: BC Managed Care – PPO

## 2020-02-09 DIAGNOSIS — R079 Chest pain, unspecified: Secondary | ICD-10-CM | POA: Diagnosis not present

## 2020-02-09 DIAGNOSIS — I209 Angina pectoris, unspecified: Secondary | ICD-10-CM | POA: Diagnosis not present

## 2020-02-09 DIAGNOSIS — E785 Hyperlipidemia, unspecified: Secondary | ICD-10-CM | POA: Diagnosis not present

## 2020-02-09 DIAGNOSIS — R0789 Other chest pain: Secondary | ICD-10-CM | POA: Diagnosis present

## 2020-02-09 DIAGNOSIS — M50222 Other cervical disc displacement at C5-C6 level: Secondary | ICD-10-CM | POA: Diagnosis not present

## 2020-02-09 DIAGNOSIS — G459 Transient cerebral ischemic attack, unspecified: Secondary | ICD-10-CM | POA: Diagnosis not present

## 2020-02-09 LAB — BASIC METABOLIC PANEL
Anion gap: 8 (ref 5–15)
BUN: 20 mg/dL (ref 6–20)
CO2: 25 mmol/L (ref 22–32)
Calcium: 9.2 mg/dL (ref 8.9–10.3)
Chloride: 105 mmol/L (ref 98–111)
Creatinine, Ser: 0.79 mg/dL (ref 0.61–1.24)
GFR calc Af Amer: >60 mL/min (ref >60)
GFR calc non Af Amer: >60 mL/min (ref >60)
Glucose, Bld: 92 mg/dL (ref 70–99)
Potassium: 4 mmol/L (ref 3.5–5.1)
Sodium: 138 mmol/L (ref 135–145)

## 2020-02-09 LAB — NM MYOCAR MULTI W/SPECT W/WALL MOTION / EF
Estimated workload: 1 METS
Exercise duration (min): 1 min
Exercise duration (sec): 2 s
LV dias vol: 84 mL (ref 62–150)
LV sys vol: 31 mL
Peak HR: 75 {beats}/min
Percent HR: 44 %
Rest HR: 52 {beats}/min
SDS: 0
SRS: 0
SSS: 0
TID: 1.06

## 2020-02-09 LAB — HEMOGLOBIN A1C
Hgb A1c MFr Bld: 4.6 % — ABNORMAL LOW (ref 4.8–5.6)
Mean Plasma Glucose: 85.32 mg/dL

## 2020-02-09 LAB — HEPARIN LEVEL (UNFRACTIONATED): Heparin Unfractionated: 0.47 IU/mL (ref 0.30–0.70)

## 2020-02-09 LAB — CBC
HCT: 39.5 % (ref 39.0–52.0)
Hemoglobin: 13.7 g/dL (ref 13.0–17.0)
MCH: 31.5 pg (ref 26.0–34.0)
MCHC: 34.7 g/dL (ref 30.0–36.0)
MCV: 90.8 fL (ref 80.0–100.0)
Platelets: 210 10*3/uL (ref 150–400)
RBC: 4.35 MIL/uL (ref 4.22–5.81)
RDW: 11.3 % — ABNORMAL LOW (ref 11.5–15.5)
WBC: 5.7 10*3/uL (ref 4.0–10.5)
nRBC: 0 % (ref 0.0–0.2)

## 2020-02-09 LAB — LIPID PANEL
Cholesterol: 200 mg/dL (ref 0–200)
HDL: 47 mg/dL (ref >40)
LDL Cholesterol: 127 mg/dL — ABNORMAL HIGH (ref 0–99)
Total CHOL/HDL Ratio: 4.3 RATIO
Triglycerides: 132 mg/dL (ref <150)
VLDL: 26 mg/dL (ref 0–40)

## 2020-02-09 MED ORDER — TECHNETIUM TC 99M TETROFOSMIN IV KIT
10.1600 | PACK | Freq: Once | INTRAVENOUS | Status: AC | PRN
  Administered 2020-02-09: 10.16 via INTRAVENOUS

## 2020-02-09 MED ORDER — TECHNETIUM TC 99M TETROFOSMIN IV KIT
30.0000 | PACK | Freq: Once | INTRAVENOUS | Status: AC | PRN
  Administered 2020-02-09: 33.576 via INTRAVENOUS

## 2020-02-09 MED ORDER — NITROGLYCERIN 0.4 MG SL SUBL: 0.4000 mg | SUBLINGUAL_TABLET | SUBLINGUAL | 1 refills | Status: DC | PRN

## 2020-02-09 MED ORDER — REGADENOSON 0.4 MG/5ML IV SOLN
0.4000 mg | Freq: Once | INTRAVENOUS | Status: AC
  Administered 2020-02-09: 0.4 mg via INTRAVENOUS

## 2020-02-09 MED ORDER — ASPIRIN 81 MG PO TBEC: 81.0000 mg | DELAYED_RELEASE_TABLET | Freq: Every day | ORAL | 1 refills | Status: DC

## 2020-02-09 MED ORDER — ATORVASTATIN CALCIUM 40 MG PO TABS: 40.0000 mg | ORAL_TABLET | Freq: Every day | ORAL | 1 refills | Status: DC

## 2020-02-09 NOTE — Consult Note (Addendum)
ANTICOAGULATION CONSULT NOTE  Pharmacy Consult for Heparin Infusion  Indication: chest pain/ACS  No Known Allergies  Patient Measurements: Height: 5\' 10"  (177.8 cm) Weight: 150 lb (68 kg) IBW/kg (Calculated) : 73  Vital Signs: Temp: 97.9 F (36.6 C) (03/04 0739) Temp Source: Oral (03/04 0417) BP: 89/67 (03/04 0739) Pulse Rate: 60 (03/04 0739)  Labs: Recent Labs    02/08/20 0856 02/08/20 1113 02/08/20 1228 02/08/20 1632 02/08/20 2242 02/09/20 0717  HGB 13.9  --   --   --   --  13.7  HCT 40.8  --   --   --   --  39.5  PLT 225  --   --   --   --  210  APTT 28  --   --   --   --   --   LABPROT 12.1  --   --   --   --   --   INR 0.9  --   --   --   --   --   HEPARINUNFRC  --   --   --  0.37 0.30 0.47  CREATININE 0.87  --   --   --   --   --   TROPONINIHS <2 <2 <2  --   --   --     Estimated Creatinine Clearance: 95.5 mL/min (by C-G formula based on SCr of 0.87 mg/dL).   Medical History: Past Medical History:  Diagnosis Date  . History of chicken pox   . History of nephrolithiasis 2012  . Smoker    Assessment: Pharmacy consulted for heparin infusion dosing and monitoring for 54 yo male admitted with ACS/STEMI.   3/3 1632 HL 0.37  3/3 2242 HL 0.3 trended down to lower end of goal range, increased heparin to 900 units/hr  3/4 0717 HL 0.47   Goal of Therapy:  Heparin level 0.3-0.7 units/ml Monitor platelets by anticoagulation protocol: Yes   Plan:  Heparin level is therapeutic x 2. Continue heparin drip at 900 units/hr. CBC daily while on heparin drip. Will order heparin level and CBC with AM labs. Plan to continue heparin for 24-48 hours.   [id number], PharmD, BCPS Clinical Pharmacist 02/09/2020 8:49 AM

## 2020-02-09 NOTE — Discharge Instructions (Signed)
Transient Ischemic Attack  A transient ischemic attack (TIA) is a "warning stroke" that causes stroke-like symptoms that go away quickly. A TIA does not cause lasting damage to the brain. But having a TIA is a sign that you may be at risk for a stroke. Lifestyle changes and medical treatments can help prevent a stroke. It is important to know the symptoms of a TIA and what to do. Get help right away, even if your symptoms go away. The symptoms of a TIA are the same as those of a stroke. They can happen fast, and they usually go away within minutes or hours. They can include:  Weakness or loss of feeling in your face, arm, or leg. This often happens on one side of your body.  Trouble walking.  Trouble moving your arms or legs.  Trouble talking or understanding what people are saying.  Trouble seeing.  Seeing two of one object (double vision).  Feeling dizzy.  Feeling confused.  Loss of balance or coordination.  Feeling sick to your stomach (nauseous) and throwing up (vomiting).  A very bad headache for no reason. What increases the risk? Certain things may make you more likely to have a TIA. Some of these are things that you can change, such as:  Being very overweight (obese).  Using products that contain nicotine or tobacco, such as cigarettes and e-cigarettes.  Taking birth control pills.  Not being active.  Drinking too much alcohol.  Using drugs. Other risk factors include:  Having an irregular heartbeat (atrial fibrillation).  Being African American or Hispanic.  Having had blood clots, stroke, TIA, or heart attack in the past.  Being a woman with a history of high blood pressure in pregnancy (preeclampsia).  Being over the age of 60.  Being male.  Having family history of stroke.  Having the following diseases or conditions: ? High blood pressure. ? High cholesterol. ? Diabetes. ? Heart disease. ? Sickle cell disease. ? Sleep apnea. ? Migraine  headache. ? Long-term (chronic) diseases that cause soreness and swelling (inflammation). ? Disorders that affect how your blood clots. Follow these instructions at home: Medicines   Take over-the-counter and prescription medicines only as told by your doctor.  If you were told to take aspirin or another medicine to thin your blood, take it exactly as told by your doctor. ? Taking too much of the medicine can cause bleeding. ? Taking too little of the medicine may not work to treat the problem. Eating and drinking   Eat 5 or more servings of fruits and vegetables each day.  Follow instructions from your doctor about your diet. You may need to follow a certain diet to help lower your risk of having a stroke. You may need to: ? Eat a diet that is low in fat and salt. ? Eat foods that contain a lot of fiber. ? Limit the amount of carbohydrates and sugar in your diet.  Limit alcohol intake to 1 drink a day for nonpregnant women and 2 drinks a day for men. One drink equals 12 oz of beer, 5 oz of wine, or 1 oz of hard liquor. General instructions  Keep a healthy weight.  Stay active. Try to get at least 30 minutes of activity on all or most days.  Find out if you have a condition called sleep apnea. Get treatment if needed.  Do not use any products that contain nicotine or tobacco, such as cigarettes and e-cigarettes. If you need help quitting,   ask your doctor.  Do not abuse drugs.  Keep all follow-up visits as told by your doctor. This is important. Get help right away if:  You have any signs of stroke. "BE FAST" is an easy way to remember the main warning signs: ? B - Balance. Signs are dizziness, sudden trouble walking, or loss of balance. ? E - Eyes. Signs are trouble seeing or a sudden change in how you see. ? F - Face. Signs are sudden weakness or loss of feeling of the face, or the face or eyelid drooping on one side. ? A - Arms. Signs are weakness or loss of feeling in an  arm. This happens suddenly and usually on one side of the body. ? S - Speech. Signs are sudden trouble speaking, slurred speech, or trouble understanding what people say. ? T - Time. Time to call emergency services. Write down what time symptoms started.  You have other signs of stroke, such as: ? A sudden, very bad headache with no known cause. ? Feeling sick to your stomach (nausea). ? Throwing up (vomiting). ? Jerky movements that you cannot control (seizure). These symptoms may be an emergency. Do not wait to see if the symptoms will go away. Get medical help right away. Call your local emergency services (911 in the [place] Do not drive yourself to the hospital. Summary  A transient ischemic attack (TIA) is a "warning stroke" that causes stroke-like symptoms that go away quickly.  A TIA is a medical emergency. Get help right away, even if your symptoms go away.  A TIA does not cause lasting damage to the brain.  Having a TIA is a sign that you may be at risk for a stroke. Lifestyle changes and medical treatments can help prevent a stroke. This information is not intended to replace advice given to you by your health care provider. Make sure you discuss any questions you have with your health care provider. Document Revised: 08/20/2018 Document Reviewed: 02/25/2017 Elsevier Patient Education  2020 [employer]vier Inc[employer]    Angina  Angina is very bad discomfort or pain in the chest, neck, arm, jaw, or back. The discomfort is caused by a lack of blood in the middle layer of the heart wall (myocardium). What are the causes? This condition is caused by a buildup of fat and cholesterol (plaque) in your arteries (atherosclerosis). This buildup narrows the arteries and makes it hard for blood to flow. What increases the risk? You are more likely to develop this condition if:  You have high levels of cholesterol in your blood.  You have high blood pressure (hypertension).  You have  diabetes.  You have a family history of heart disease.  You are not active, or you do not exercise enough.  You feel sad (depressed).  You have been treated with high energy rays (radiation) on the left side of your chest. Other risk factors are:  Using tobacco.  Being very overweight (obese).  Eating a diet high in unhealthy fats (saturated fats).  Having stress, or being exposed to things that cause stress.  Using drugs, such as cocaine. Women have a greater risk for angina if:  They are older than 70.  They have stopped having their period (are in postmenopause). What are the signs or symptoms? Common symptoms of this condition in both men and women may include:  Chest pain, which may: ? Feel like a crushing or squeezing in the chest. ? Feel like a tightness, pressure, fullness, or  heaviness in the chest. ? Last for more than a few minutes at a time. ? Stop and come back (recur) after a few minutes.  Pain in the neck, arm, jaw, or back.  Heartburn or upset stomach (indigestion) for no reason.  Being short of breath.  Feeling sick to your stomach (nauseous).  Sudden cold sweats. Women and people with diabetes may have other symptoms that are not usual, such as feeling:  Tired (fatigue).  Worried or nervous (anxious) for no reason.  Weak for no reason.  Dizzy or passing out (fainting). How is this treated? This condition may be treated with:  Medicines. These are given to: ? Prevent blood clots. ? Prevent heart attack. ? Relax blood vessels and improve blood flow to the heart (nitrates). ? Reduce blood pressure. ? Improve the pumping action of the heart. ? Reduce fat and cholesterol in the blood.  A procedure to widen a narrowed or blocked artery in the heart (angioplasty).  Surgery to allow blood to go around a blocked artery (coronary artery bypass surgery). Follow these instructions at home: Medicines  Take over-the-counter and prescription  medicines only as told by your doctor.  Do not take these medicines unless your doctor says that you can: ? NSAIDs. These include:  Ibuprofen.  Naproxen. ? Vitamin supplements that have vitamin A, vitamin E, or both. ? Hormone therapy that contains estrogen with or without progestin. Eating and drinking   Eat a heart-healthy diet that includes: ? Lots of fresh fruits and vegetables. ? Whole grains. ? Low-fat (lean) protein. ? Low-fat dairy products.  Follow instructions from your doctor about what you cannot eat or drink. Activity  Follow an exercise program that your doctor tells you.  Talk with your doctor about joining a program to help improve the health of your heart (cardiac rehab).  When you feel tired, take a break. Plan breaks if you know you are going to feel tired. Lifestyle   Do not use any products that contain nicotine or tobacco. This includes cigarettes, e-cigarettes, and chewing tobacco. If you need help quitting, ask your doctor.  If your doctor says you can drink alcohol: ? Limit how much you use to:  0-1 drink a day for women who are not pregnant.  0-2 drinks a day for men. ? Be aware of how much alcohol is in your drink. In the [place] one drink equals:  One 12 oz bottle of beer (355 mL).  One 5 oz glass of wine (148 mL).  One 1 oz glass of hard liquor (44 mL). General instructions  Stay at a healthy weight. If your doctor tells you to do so, work with him or her to lose weight.  Learn to deal with stress. If you need help, ask your doctor.  Keep your vaccines up to date. Get a flu shot every year.  Talk with your doctor if you feel sad. Take a screening test to see if you are at risk for depression.  Work with your doctor to manage any other health problems that you have. These may include diabetes or high blood pressure.  Keep all follow-up visits as told by your doctor. This is important. Get help right away if:  You have pain in your  chest, neck, arm, jaw, or back, and the pain: ? Lasts more than a few minutes. ? Comes back. ? Does not get better after you take medicine under your tongue (sublingual nitroglycerin). ? Keeps getting worse. ? Comes more  often.  You have any of these problems for no reason: ? Sweating a lot. ? Heartburn or upset stomach. ? Shortness of breath. ? Trouble breathing. ? Feeling sick to your stomach. ? Throwing up (vomiting). ? Feeling more tired than normal. ? Feeling nervous or worrying more than normal. ? Weakness.  You are suddenly dizzy or light-headed.  You pass out. These symptoms may be an emergency. Do not wait to see if the symptoms will go away. Get medical help right away. Call your local emergency services (911 in the [place] Do not drive yourself to the hospital. Summary  Angina is very bad discomfort or pain in the chest, neck, arm, neck, or back.  Symptoms include chest pain, heartburn or upset stomach for no reason, and shortness of breath.  Women or people with diabetes may have symptoms that are not usual, such as feeling nervous or worried for no reason, weak for no reason, or tired.  Take all medicines only as told by your doctor.  You should eat a heart-healthy diet and follow an exercise program. This information is not intended to replace advice given to you by your health care provider. Make sure you discuss any questions you have with your health care provider. Document Revised: 07/12/2018 Document Reviewed: 07/12/2018 Elsevier Patient Education  2020 [employer].

## 2020-02-09 NOTE — Consult Note (Signed)
ANTICOAGULATION CONSULT NOTE  Pharmacy Consult for Heparin Infusion  Indication: chest pain/ACS  No Known Allergies  Patient Measurements: Height: 5\' 10"  (177.8 cm) Weight: 150 lb (68 kg) IBW/kg (Calculated) : 73  Vital Signs: Temp: 97.8 F (36.6 C) (03/03 2011) Temp Source: Oral (03/03 2011) BP: 103/74 (03/03 2011) Pulse Rate: 58 (03/03 2011)  Labs: Recent Labs    02/08/20 0856 02/08/20 1113 02/08/20 1228 02/08/20 1632 02/08/20 2242  HGB 13.9  --   --   --   --   HCT 40.8  --   --   --   --   PLT 225  --   --   --   --   APTT 28  --   --   --   --   LABPROT 12.1  --   --   --   --   INR 0.9  --   --   --   --   HEPARINUNFRC  --   --   --  0.37 0.30  CREATININE 0.87  --   --   --   --   TROPONINIHS <2 <2 <2  --   --     Estimated Creatinine Clearance: 95.5 mL/min (by C-G formula based on SCr of 0.87 mg/dL).   Medical History: Past Medical History:  Diagnosis Date  . History of chicken pox   . History of nephrolithiasis 2012  . Smoker    Assessment: Pharmacy consulted for heparin infusion dosing and monitoring for 54 yo male admitted with ACS/STEMI.   Goal of Therapy:  Heparin level 0.3-0.7 units/ml Monitor platelets by anticoagulation protocol: Yes   Plan:  3/3 1632 HL 0.37 therapeutic x 1. Continue heparin drip at 800 units/hr. HL at 2300 to confirm. CBC daily while on heparin drip.  3/3 2242 HL 0.30, therapeutic x 2 but has trended down to lowest end of goal range, will increase heparin to 900 units/hr to discourage further drop and recheck HL in am to confirm remains therapeutic.    45, PharmD Clinical Pharmacist 02/09/2020 12:20 AM

## 2020-02-09 NOTE — Discharge Summary (Signed)
Physician Discharge Summary  [place] XIP:[id number] DOB: [birth year] DOA: 02/08/2020  PCP: Derrick Boyden, MD  Admit date: 02/08/2020 Discharge date: 02/09/2020  Admitted From: Home Disposition: Home  Recommendations for Outpatient Follow-up:  1. Follow up with PCP in 1 week 2. Follow-up with cardiology (Dr. Juliann Pares) in 1-2 weeks  Home Health: None Equipment/Devices:none  Discharge Condition: Fair CODE STATUS: Full code Diet recommendation: Heart Healthy     Discharge Diagnoses:  Principal Problem:   TIA (transient ischemic attack)  Active Problems:   Smoker   Dyslipidemia   Angina pectoris [facility])   Brief narrative/HPI 54 year old male, smoker (uses vapor) with no significant past medical history presented to the ED with acute onset of left arm numbness radiating to the left side of the face since 1 day duration.  On the day of admission he also started having chest pressure over the left anterior chest wall, nonradiating without any aggravating or relieving factors, no shortness of breath, nausea, vomiting, palpitations or diaphoresis.  Chest pain lasted for almost 3 hours and during evaluation in the ED he was chest pain-free. No prior history of chest pain symptoms or family history of heart disease. In the ED twelve-lead EKG showed a short ST elevation in inferior lead.  Troponin was negative.  Cardiology consulted and patient admitted to telemetry for ACS rule out.  Hospital course  Principal problem Transient ischemic attack (TIA) Symptoms have resolved.  CT head and MRI brain without acute findings.  MRI of the cervical spine unremarkable as well.  Shows small central disc protrusion at C5-C6 without neural compression.  Also foraminal narrowing with possible impingement on the right C7 nerve root. Ultrasound of the carotid without significant plaque or stenosis.  2D echo with normal EF and no wall motion abnormality. Patient started on aspirin 81 mg daily.  LDL  of 127.  Started on Zocor 40 mg daily. A1c of 4.6.  Neurology consult appreciated.  No further symptoms.  Likely etiology for TIA is dyslipidemia and tobacco use. Counseled on secondary lifestyle modification and smoking cessation. Patient will be discharged on baby aspirin and statin.   Chest pain at rest Suspect stable angina.  2D echo with normal EF and no wall motion abnormality.  No further chest pain symptoms.  Was placed on IV heparin.  Stable on telemetry.  Lexiscan Myoview done was a low risk test. Patient will discharge on aspirin, statin and as needed nitrate.  He is instructed to follow-up with Dr. Juliann Pares in 1-2 weeks.  Procedure: CT head, MRI brain, MRI cervical spine, 2D echo, carotid ultrasound, Lexiscan Myoview  Family communication: None Disposition: Home   Discharge Instructions   Allergies as of 02/09/2020   No Known Allergies     Medication List    TAKE these medications   AIRBORNE PO Take 1 tablet by mouth daily.   aspirin 81 MG EC tablet Take 1 tablet (81 mg total) by mouth daily. Start taking on: February 10, 2020   atorvastatin 40 MG tablet Commonly known as: LIPITOR Take 1 tablet (40 mg total) by mouth daily at 6 PM.   cholecalciferol 25 MCG (1000 UNIT) tablet Commonly known as: VITAMIN D3 Take 1,000 Units by mouth daily.   ibuprofen 200 MG tablet Commonly known as: ADVIL Take 200 mg by mouth every 6 (six) hours as needed.   nitroGLYCERIN 0.4 MG SL tablet Commonly known as: NITROSTAT Place 1 tablet (0.4 mg total) under the tongue every 5 (five) minutes x 3 doses as needed  for chest pain.   zinc gluconate 50 MG tablet Take 50 mg by mouth daily.      Follow-up Information    Derrick Boyden, MD. Schedule an appointment as soon as possible for a visit in 1 week(s).   Specialty: Family Medicine Contact information: [address] [place] [address] [phone]        [name], MD Follow up in 2 week(s).    Specialties: Cardiology, Internal Medicine Contact information: [address] [place] [address] [phone]          No Known Allergies    Procedures/Studies: CT Head Wo Contrast  Result Date: 02/08/2020 CLINICAL DATA:  TIA.  Left side numbness EXAM: CT HEAD WITHOUT CONTRAST TECHNIQUE: Contiguous axial images were obtained from the base of the skull through the vertex without intravenous contrast. COMPARISON:  None. FINDINGS: Brain: No acute intracranial abnormality. Specifically, no hemorrhage, hydrocephalus, mass lesion, acute infarction, or significant intracranial injury. Vascular: No hyperdense vessel or unexpected calcification. Skull: No acute calvarial abnormality. Sinuses/Orbits: Visualized paranasal sinuses and mastoids clear. Orbital soft tissues unremarkable. Other: None IMPRESSION: Normal study. Electronically Signed   By: Charlett Nose M.D.   On: 02/08/2020 09:51   MR BRAIN WO CONTRAST  Result Date: 02/08/2020 CLINICAL DATA:  54 year old male with sudden onset left side numbness last night lasting about an hour. EXAM: MRI HEAD WITHOUT CONTRAST TECHNIQUE: Multiplanar, multiecho pulse sequences of the brain and surrounding structures were obtained without intravenous contrast. COMPARISON:  Head CT this morning. FINDINGS: Brain: No restricted diffusion to suggest acute infarction. No midline shift, mass effect, evidence of mass lesion, ventriculomegaly, extra-axial collection or acute intracranial hemorrhage. Cervicomedullary junction and pituitary are within normal limits. Normal cerebral volume. Wallace Cullens and white matter signal is within normal limits for age throughout the brain. No encephalomalacia or chronic cerebral blood products identified. Vascular: Major intracranial vascular flow voids are preserved. Skull and upper cervical spine: Normal visible cervical spine. Normal bone marrow signal. Sinuses/Orbits: Negative. Other: Paranasal sinuses and mastoids are clear.  Visible internal auditory structures appear normal. Scalp and face soft tissues appear negative. IMPRESSION: Normal noncontrast MRI appearance of the brain. Electronically Signed   By: Odessa Fleming M.D.   On: 02/08/2020 14:16   MR CERVICAL SPINE WO CONTRAST  Result Date: 02/09/2020 CLINICAL DATA:  Episode of arm numbness 24 hours ago. EXAM: MRI CERVICAL SPINE WITHOUT CONTRAST TECHNIQUE: Multiplanar, multisequence MR imaging of the cervical spine was performed. No intravenous contrast was administered. COMPARISON:  None. FINDINGS: Alignment: Normal Vertebrae: Normal marrow signal. No bone lesions or fractures. Cord: Normal cord signal intensity. No cord lesions or syrinx. Posterior Fossa, vertebral arteries, paraspinal tissues: No significant findings. Disc levels: C2-3: No significant findings. C3-4: No significant findings. C4-5: No significant findings. C5-6: Small central disc protrusion with mild impression on the ventral thecal sac. No direct neural compression. No foraminal stenosis. C6-7: Shallow disc protrusion with slight central focality. Mild impression on the ventral thecal sac but no direct neural compression. There is also a E shallow right foraminal disc osteophyte complex with right foraminal stenosis and probable impingement on the right C7 nerve root. C7-T1: No significant findings. IMPRESSION: 1. Small central disc protrusion at C5-6. No significant neural compression. 2. Shallow disc protrusion at C6-7 with slight central focality. There is also a shallow right foraminal disc osteophyte complex with foraminal narrowing and probable impingement on the right C7 nerve root. 3. Normal appearance of the cervical spinal cord. Electronically Signed  By: Marijo Sanes M.D.   On: 02/09/2020 10:01   US Carotid Bilateral  Result Date: 02/09/2020 CLINICAL DATA:  TIA.  Previous tobacco abuse EXAM: BILATERAL CAROTID DUPLEX ULTRASOUND TECHNIQUE: Pearline Cables scale imaging, color Doppler and duplex ultrasound were  performed of bilateral carotid and vertebral arteries in the neck. COMPARISON:  None. FINDINGS: Criteria: Quantification of carotid stenosis is based on velocity parameters that correlate the residual internal carotid diameter with NASCET-based stenosis levels, using the diameter of the distal internal carotid lumen as the denominator for stenosis measurement. The following velocity measurements were obtained: RIGHT ICA: 83/43 cm/sec CCA: 99/37 cm/sec SYSTOLIC ICA/CCA RATIO:  0.9 ECA: 62 cm/sec LEFT ICA: 96/39 cm/sec CCA: 16/96 cm/sec SYSTOLIC ICA/CCA RATIO:  1.0 ECA: 47 cm/sec RIGHT CAROTID ARTERY: No significant plaque or stenosis. Normal waveforms and color Doppler signal. RIGHT VERTEBRAL ARTERY:  Normal flow direction and waveform. LEFT CAROTID ARTERY: Mild intimal thickening in the bulb. No focal plaque accumulation or stenosis. Normal waveforms and color Doppler signal. LEFT VERTEBRAL ARTERY:  Normal flow direction and waveform. IMPRESSION: 1. No significant carotid plaque or stenosis. 2.  Antegrade bilateral vertebral arterial flow. Electronically Signed   By: Lucrezia Europe M.D.   On: 02/09/2020 14:26   NM Myocar Multi W/Spect W/Wall Motion / EF  Result Date: 02/09/2020  The study is normal.  The left ventricular ejection fraction is normal (55-65%).  There was no ST segment deviation noted during stress.  Negative lexiscan stress LV function normal Low risk study   DG Chest Portable 1 View  Result Date: 02/08/2020 CLINICAL DATA:  Episode of chest pain this morning. EXAM: PORTABLE CHEST 1 VIEW COMPARISON:  None. FINDINGS: The cardiac silhouette, mediastinal and hilar contours are normal. The lungs are clear. No pleural effusion or pneumothorax. No pulmonary lesions. The bony thorax is intact. IMPRESSION: No acute cardiopulmonary findings. Electronically Signed   By: Marijo Sanes M.D.   On: 02/08/2020 09:23   ECHOCARDIOGRAM COMPLETE  Result Date: 02/08/2020    ECHOCARDIOGRAM REPORT   Patient Name:    Derrick Young Date of Exam: 02/08/2020 Medical Rec #:  [id number]       Height:       70.0 in Accession #:    [id number]      Weight:       150.0 lb Date of Birth:  Feb 15, 1966        BSA:          1.847 m Patient Age:    62 years        BP:           92/62 mmHg Patient Gender: M               HR:           54 bpm. Exam Location:  [facility] Procedure: 2D Echo, Cardiac Doppler and Color Doppler Indications:     I21.3 STEMI  History:         Patient has no prior history of Echocardiogram examinations.                  Risk Factors:Current Smoker.  Sonographer:     Wilford Sports Rodgers-Jones Referring Phys:  [id number] TIRWERXV AGBATA Diagnosing Phys: Yolonda Kida MD IMPRESSIONS  1. Left ventricular ejection fraction, by estimation, is 65 to 70%. The left ventricle has normal function. The left ventricle has no regional wall motion abnormalities. Left ventricular diastolic parameters were normal.  2. Right ventricular systolic function is normal. The  right ventricular size is normal. There is normal pulmonary artery systolic pressure.  3. The mitral valve is normal in structure and function. No evidence of mitral valve regurgitation.  4. The aortic valve is normal in structure and function. Aortic valve regurgitation is not visualized. FINDINGS  Left Ventricle: Left ventricular ejection fraction, by estimation, is 65 to 70%. The left ventricle has normal function. The left ventricle has no regional wall motion abnormalities. The left ventricular internal cavity size was normal in size. There is  no left ventricular hypertrophy. Left ventricular diastolic parameters were normal. Right Ventricle: The right ventricular size is normal. No increase in right ventricular wall thickness. Right ventricular systolic function is normal. There is normal pulmonary artery systolic pressure. The tricuspid regurgitant velocity is 1.45 m/s, and  with an assumed right atrial pressure of 10 mmHg, the estimated right ventricular systolic pressure is  18.4 mmHg. Left Atrium: Left atrial size was normal in size. Right Atrium: Right atrial size was normal in size. Pericardium: There is no evidence of pericardial effusion. Mitral Valve: The mitral valve is normal in structure and function. No evidence of mitral valve regurgitation. Tricuspid Valve: The tricuspid valve is normal in structure. Tricuspid valve regurgitation is trivial. Aortic Valve: The aortic valve is normal in structure and function. Aortic valve regurgitation is not visualized. Pulmonic Valve: The pulmonic valve was normal in structure. Pulmonic valve regurgitation is not visualized. Aorta: The aortic root is normal in size and structure. IAS/Shunts: The atrial septum is grossly normal.  LEFT VENTRICLE PLAX 2D LVIDd:         4.45 cm  Diastology LVIDs:         2.70 cm  LV e' lateral:   13.30 cm/s LV PW:         0.85 cm  LV E/e' lateral: 3.5 LV IVS:        0.93 cm  LV e' medial:    8.70 cm/s LVOT diam:     2.00 cm  LV E/e' medial:  5.4 LV SV:         53 LV SV Index:   29 LVOT Area:     3.14 cm  RIGHT VENTRICLE RV Basal diam:  4.44 cm RV S prime:     12.50 cm/s TAPSE (M-mode): 1.6 cm LEFT ATRIUM             Index       RIGHT ATRIUM           Index LA diam:        3.30 cm 1.79 cm/m  RA Area:     13.50 cm LA Vol (A2C):   46.9 ml 25.39 ml/m RA Volume:   35.80 ml  19.38 ml/m LA Vol (A4C):   26.8 ml 14.51 ml/m LA Biplane Vol: 36.1 ml 19.54 ml/m  AORTIC VALVE LVOT Vmax:   93.80 cm/s LVOT Vmean:  63.700 cm/s LVOT VTI:    0.169 m  AORTA Ao Root diam: 3.00 cm MITRAL VALVE               TRICUSPID VALVE MV Area (PHT): 4.31 cm    TR Peak grad:   8.4 mmHg MV Decel Time: 176 msec    TR Vmax:        145.00 cm/s MV E velocity: 47.10 cm/s MV A velocity: 45.40 cm/s  SHUNTS MV E/A ratio:  1.04        Systemic VTI:  0.17 m  Systemic Diam: 2.00 cm [name] MD Electronically signed by [name] MD Signature Date/Time: 02/08/2020/8:26:27 PM    Final         Subjective: No further chest pain symptoms.  Stable on telemetry.  Discharge Exam: Vitals:   02/09/20 1349 02/09/20 1532  BP: 91/64 96/65  Pulse: (!) 55 61  Resp: 18 16  Temp: 98 F (36.7 C) 98.2 F (36.8 C)  SpO2: 99% 98%   Vitals:   02/09/20 0739 02/09/20 0957 02/09/20 1349 02/09/20 1532  BP: (!) 89/67 92/67 91/64  96/65  Pulse: 60  (!) 55 61  Resp: 16  18 16   Temp: 97.9 F (36.6 C)  98 F (36.7 C) 98.2 F (36.8 C)  TempSrc:      SpO2: 95%  99% 98%  Weight:      Height:        General: Middle-aged male not in distress HEENT: Moist mucosa, supple neck Chest: Clear CVs: Normal S1-S2, no murmurs GI: Soft, nondistended, nontender Musculoskeletal: Warm, no edema   The results of significant diagnostics from this hospitalization (including imaging, microbiology, ancillary and laboratory) are listed below for reference.     Microbiology: Recent Results (from the past 240 hour(s))  Respiratory Panel by RT PCR (Flu A&B, Covid) - Nasopharyngeal Swab     Status: None   Collection Time: 02/08/20 10:19 AM   Specimen: Nasopharyngeal Swab  Result Value Ref Range Status   SARS Coronavirus 2 by RT PCR NEGATIVE NEGATIVE Final    Comment: (NOTE) SARS-CoV-2 target nucleic acids are NOT DETECTED. The SARS-CoV-2 RNA is generally detectable in upper respiratoy specimens during the acute phase of infection. The lowest concentration of SARS-CoV-2 viral copies this assay can detect is 131 copies/mL. A negative result does not preclude SARS-Cov-2 infection and should not be used as the sole basis for treatment or other patient management decisions. A negative result may occur with  improper specimen collection/handling, submission of specimen other than nasopharyngeal swab, presence of viral mutation(s) within the areas targeted by this assay, and inadequate number of viral copies (<131 copies/mL). A negative result must be combined with clinical observations, patient  history, and epidemiological information. The expected result is Negative. Fact Sheet for Patients:  Fact Sheet for Healthcare Providers:  04/09/20 This test is not yet ap proved or cleared by the https://www.moore.com/ FDA and  has been authorized for detection and/or diagnosis of SARS-CoV-2 by FDA under an Emergency Use Authorization (EUA). This EUA will remain  in effect (meaning this test can be used) for the duration of the COVID-19 declaration under Section 564(b)(1) of the Act, 21 U.S.C. section 360bbb-3(b)(1), unless the authorization is terminated or revoked sooner.    Influenza A by PCR NEGATIVE NEGATIVE Final   Influenza B by PCR NEGATIVE NEGATIVE Final    Comment: (NOTE) The Xpert Xpress SARS-CoV-2/FLU/RSV assay is intended as an aid in  the diagnosis of influenza from Nasopharyngeal swab specimens and  should not be used as a sole basis for treatment. Nasal washings and  aspirates are unacceptable for Xpert Xpress SARS-CoV-2/FLU/RSV  testing. Fact Sheet for Patients: https://www.young.biz/ Fact Sheet for Healthcare Providers: [place] This test is not yet approved or cleared by the https://www.moore.com/ FDA and  has been authorized for detection and/or diagnosis of SARS-CoV-2 by  FDA under an Emergency Use Authorization (EUA). This EUA will remain  in effect (meaning this test can be used) for the duration of the  Covid-19 declaration under  Section 564(b)(1) of the Act, 21  U.S.C. section 360bbb-3(b)(1), unless the authorization is  terminated or revoked. Performed at [facility], [address] [place], [place] [address]      Labs: BNP (last 3 results) Recent Labs    02/08/20 0856  BNP 14.0   Basic Metabolic Panel: Recent Labs  Lab 02/08/20 0856 02/09/20 0717  NA 140 138  K 4.0 4.0  CL 103 105  CO2 27 25  GLUCOSE 90 92  BUN 17  20  CREATININE 0.87 0.79  CALCIUM 9.6 9.2   Liver Function Tests: Recent Labs  Lab 02/08/20 0856  AST 18  ALT 15  ALKPHOS 47  BILITOT 0.8  PROT 7.2  ALBUMIN 4.2   Recent Labs  Lab 02/08/20 0856  LIPASE 22   No results for input(s): AMMONIA in the last 168 hours. CBC: Recent Labs  Lab 02/08/20 0856 02/09/20 0717  WBC 6.4 5.7  NEUTROABS 3.1  --   HGB 13.9 13.7  HCT 40.8 39.5  MCV 90.1 90.8  PLT 225 210   Cardiac Enzymes: No results for input(s): CKTOTAL, CKMB, CKMBINDEX, TROPONINI in the last 168 hours. BNP: Invalid input(s): POCBNP CBG: No results for input(s): GLUCAP in the last 168 hours. D-Dimer No results for input(s): DDIMER in the last 72 hours. Hgb A1c Recent Labs    02/09/20 0717  HGBA1C 4.6*   Lipid Profile Recent Labs    02/09/20 0717  CHOL 200  HDL 47  LDLCALC 127*  TRIG 132  CHOLHDL 4.3   Thyroid function studies No results for input(s): TSH, T4TOTAL, T3FREE, THYROIDAB in the last 72 hours.  Invalid input(s): FREET3 Anemia work up No results for input(s): VITAMINB12, FOLATE, FERRITIN, TIBC, IRON, RETICCTPCT in the last 72 hours. Urinalysis    Component Value Date/Time   COLORURINE AMBER (A) 02/08/2020 0856   APPEARANCEUR HAZY (A) 02/08/2020 0856   LABSPEC 1.019 02/08/2020 0856   PHURINE 5.0 02/08/2020 0856   GLUCOSEU NEGATIVE 02/08/2020 0856   HGBUR NEGATIVE 02/08/2020 0856   BILIRUBINUR NEGATIVE 02/08/2020 0856   BILIRUBINUR negative 07/05/2018 0855   KETONESUR NEGATIVE 02/08/2020 0856   PROTEINUR NEGATIVE 02/08/2020 0856   UROBILINOGEN 0.2 07/05/2018 0855   NITRITE NEGATIVE 02/08/2020 0856   LEUKOCYTESUR NEGATIVE 02/08/2020 0856   Sepsis Labs Invalid input(s): PROCALCITONIN,  WBC,  LACTICIDVEN Microbiology Recent Results (from the past 240 hour(s))  Respiratory Panel by RT PCR (Flu A&B, Covid) - Nasopharyngeal Swab     Status: None   Collection Time: 02/08/20 10:19 AM   Specimen: Nasopharyngeal Swab  Result Value Ref  Range Status   SARS Coronavirus 2 by RT PCR NEGATIVE NEGATIVE Final    Comment: (NOTE) SARS-CoV-2 target nucleic acids are NOT DETECTED. The SARS-CoV-2 RNA is generally detectable in upper respiratoy specimens during the acute phase of infection. The lowest concentration of SARS-CoV-2 viral copies this assay can detect is 131 copies/mL. A negative result does not preclude SARS-Cov-2 infection and should not be used as the sole basis for treatment or other patient management decisions. A negative result may occur with  improper specimen collection/handling, submission of specimen other than nasopharyngeal swab, presence of viral mutation(s) within the areas targeted by this assay, and inadequate number of viral copies (<131 copies/mL). A negative result must be combined with clinical observations, patient history, and epidemiological information. The expected result is Negative. Fact Sheet for Patients:  https://www.moore.com/https://www.fda.gov/media/142436/download Fact Sheet for Healthcare Providers:  https://www.young.biz/https://www.fda.gov/media/142435/download This test is not yet ap proved or  cleared by the [place] FDA and  has been authorized for detection and/or diagnosis of SARS-CoV-2 by FDA under an Emergency Use Authorization (EUA). This EUA will remain  in effect (meaning this test can be used) for the duration of the COVID-19 declaration under Section 564(b)(1) of the Act, 21 U.S.C. section 360bbb-3(b)(1), unless the authorization is terminated or revoked sooner.    Influenza A by PCR NEGATIVE NEGATIVE Final   Influenza B by PCR NEGATIVE NEGATIVE Final    Comment: (NOTE) The Xpert Xpress SARS-CoV-2/FLU/RSV assay is intended as an aid in  the diagnosis of influenza from Nasopharyngeal swab specimens and  should not be used as a sole basis for treatment. Nasal washings and  aspirates are unacceptable for Xpert Xpress SARS-CoV-2/FLU/RSV  testing. Fact Sheet for  Patients: https://www.moore.com/https://www.fda.gov/media/142436/download Fact Sheet for Healthcare Providers: https://www.young.biz/https://www.fda.gov/media/142435/download This test is not yet approved or cleared by the [place] FDA and  has been authorized for detection and/or diagnosis of SARS-CoV-2 by  FDA under an Emergency Use Authorization (EUA). This EUA will remain  in effect (meaning this test can be used) for the duration of the  Covid-19 declaration under Section 564(b)(1) of the Act, 21  U.S.C. section 360bbb-3(b)(1), unless the authorization is  terminated or revoked. Performed at [facility], [address] [place], [place] [address]      Time coordinating discharge: 25 minutes  SIGNED:   Eddie NorthNishant Comer Devins, MD  [facility] 02/09/2020, 3:37 PM Pager   If 7PM-7AM, please contact night-coverage [email or web address] Password [email or web address]

## 2020-02-09 NOTE — Plan of Care (Signed)
  Problem: Education: Goal: Knowledge of General Education information will improve Description: Including pain rating scale, medication(s)/side effects and non-pharmacologic comfort measures Outcome: Progressing   Problem: Health Behavior/Discharge Planning: Goal: Ability to manage health-related needs will improve Outcome: Progressing   Problem: Clinical Measurements: Goal: Ability to maintain clinical measurements within normal limits will improve Outcome: Not Progressing Note: MRI of cervical spine was positive. See full report for details. Will continue to monitor overall progression for the remainder of the shift. Patient likely to be discharged this evening. Jari Favre [facility]

## 2020-02-09 NOTE — Progress Notes (Signed)
Subjective: Patient reports no recurrence of his LUE numbness.    Objective: Current vital signs: BP 92/67   Pulse 60   Temp 97.9 F (36.6 C)   Resp 16   Ht 5\' 10"  (1.778 m)   Wt 68 kg   SpO2 95%   BMI 21.52 kg/m  Vital signs in last 24 hours: Temp:  [97.8 F (36.6 C)-98.2 F (36.8 C)] 97.9 F (36.6 C) (03/04 0739) Pulse Rate:  [54-60] 60 (03/04 0739) Resp:  [16-20] 16 (03/04 0739) BP: (89-103)/(53-74) 92/67 (03/04 0957) SpO2:  [95 %-100 %] 95 % (03/04 0739)  Intake/Output from previous day: 03/03 0701 - 03/04 0700 In: 302 [P.O.:240; I.V.:62] Out: 400 [Urine:400] Intake/Output this shift: No intake/output data recorded. Nutritional status:  Diet Order            Diet NPO time specified  Diet effective now              Neurologic Exam: Mental Status: Alert, oriented, thought content appropriate.  Speech fluent without evidence of aphasia.  Able to follow 3 step commands without difficulty. Cranial Nerves: II: Visual fields grossly normal, pupils equal, round, reactive to light and accommodation III,IV, VI: ptosis not present, extra-ocular motions intact bilaterally V,VII: smile symmetric, facial light touch sensation normal bilaterally VIII: hearing normal bilaterally IX,X: gag reflex present XI: bilateral shoulder shrug XII: midline tongue extension Motor: 5/5 throughout Sensory: Pinprick and light touch intact throughout, bilaterally Deep Tendon Reflexes: Symmetric throughout   Lab Results: Basic Metabolic Panel: Recent Labs  Lab 02/08/20 0856 02/09/20 0717  NA 140 138  K 4.0 4.0  CL 103 105  CO2 27 25  GLUCOSE 90 92  BUN 17 20  CREATININE 0.87 0.79  CALCIUM 9.6 9.2    Liver Function Tests: Recent Labs  Lab 02/08/20 0856  AST 18  ALT 15  ALKPHOS 47  BILITOT 0.8  PROT 7.2  ALBUMIN 4.2   Recent Labs  Lab 02/08/20 0856  LIPASE 22   No results for input(s): AMMONIA in the last 168 hours.  CBC: Recent Labs  Lab 02/08/20 0856  02/09/20 0717  WBC 6.4 5.7  NEUTROABS 3.1  --   HGB 13.9 13.7  HCT 40.8 39.5  MCV 90.1 90.8  PLT 225 210    Cardiac Enzymes: No results for input(s): CKTOTAL, CKMB, CKMBINDEX, TROPONINI in the last 168 hours.  Lipid Panel: Recent Labs  Lab 02/09/20 0717  CHOL 200  TRIG 132  HDL 47  CHOLHDL 4.3  VLDL 26  LDLCALC 127*    CBG: No results for input(s): GLUCAP in the last 168 hours.  Microbiology: Results for orders placed or performed during the hospital encounter of 02/08/20  Respiratory Panel by RT PCR (Flu A&B, Covid) - Nasopharyngeal Swab     Status: None   Collection Time: 02/08/20 10:19 AM   Specimen: Nasopharyngeal Swab  Result Value Ref Range Status   SARS Coronavirus 2 by RT PCR NEGATIVE NEGATIVE Final    Comment: (NOTE) SARS-CoV-2 target nucleic acids are NOT DETECTED. The SARS-CoV-2 RNA is generally detectable in upper respiratoy specimens during the acute phase of infection. The lowest concentration of SARS-CoV-2 viral copies this assay can detect is 131 copies/mL. A negative result does not preclude SARS-Cov-2 infection and should not be used as the sole basis for treatment or other patient management decisions. A negative result may occur with  improper specimen collection/handling, submission of specimen other than nasopharyngeal swab, presence of viral mutation(s) within the  areas targeted by this assay, and inadequate number of viral copies (<131 copies/mL). A negative result must be combined with clinical observations, patient history, and epidemiological information. The expected result is Negative. Fact Sheet for Patients:  https://www.moore.com/ Fact Sheet for Healthcare Providers:  https://www.young.biz/ This test is not yet ap proved or cleared by the [place] FDA and  has been authorized for detection and/or diagnosis of SARS-CoV-2 by FDA under an Emergency Use Authorization (EUA). This EUA will  remain  in effect (meaning this test can be used) for the duration of the COVID-19 declaration under Section 564(b)(1) of the Act, 21 U.S.C. section 360bbb-3(b)(1), unless the authorization is terminated or revoked sooner.    Influenza A by PCR NEGATIVE NEGATIVE Final   Influenza B by PCR NEGATIVE NEGATIVE Final    Comment: (NOTE) The Xpert Xpress SARS-CoV-2/FLU/RSV assay is intended as an aid in  the diagnosis of influenza from Nasopharyngeal swab specimens and  should not be used as a sole basis for treatment. Nasal washings and  aspirates are unacceptable for Xpert Xpress SARS-CoV-2/FLU/RSV  testing. Fact Sheet for Patients: https://www.moore.com/ Fact Sheet for Healthcare Providers: https://www.young.biz/ This test is not yet approved or cleared by the [place] FDA and  has been authorized for detection and/or diagnosis of SARS-CoV-2 by  FDA under an Emergency Use Authorization (EUA). This EUA will remain  in effect (meaning this test can be used) for the duration of the  Covid-19 declaration under Section 564(b)(1) of the Act, 21  U.S.C. section 360bbb-3(b)(1), unless the authorization is  terminated or revoked. Performed at [facility], [address], [place], [place] [address]     Coagulation Studies: Recent Labs    02/08/20 0856  LABPROT 12.1  INR 0.9    Imaging: CT Head Wo Contrast  Result Date: 02/08/2020 CLINICAL DATA:  TIA.  Left side numbness EXAM: CT HEAD WITHOUT CONTRAST TECHNIQUE: Contiguous axial images were obtained from the base of the skull through the vertex without intravenous contrast. COMPARISON:  None. FINDINGS: Brain: No acute intracranial abnormality. Specifically, no hemorrhage, hydrocephalus, mass lesion, acute infarction, or significant intracranial injury. Vascular: No hyperdense vessel or unexpected calcification. Skull: No acute calvarial abnormality. Sinuses/Orbits: Visualized paranasal  sinuses and mastoids clear. Orbital soft tissues unremarkable. Other: None IMPRESSION: Normal study. Electronically Signed   By: Charlett Nose M.D.   On: 02/08/2020 09:51   MR BRAIN WO CONTRAST  Result Date: 02/08/2020 CLINICAL DATA:  54 year old male with sudden onset left side numbness last night lasting about an hour. EXAM: MRI HEAD WITHOUT CONTRAST TECHNIQUE: Multiplanar, multiecho pulse sequences of the brain and surrounding structures were obtained without intravenous contrast. COMPARISON:  Head CT this morning. FINDINGS: Brain: No restricted diffusion to suggest acute infarction. No midline shift, mass effect, evidence of mass lesion, ventriculomegaly, extra-axial collection or acute intracranial hemorrhage. Cervicomedullary junction and pituitary are within normal limits. Normal cerebral volume. Wallace Cullens and white matter signal is within normal limits for age throughout the brain. No encephalomalacia or chronic cerebral blood products identified. Vascular: Major intracranial vascular flow voids are preserved. Skull and upper cervical spine: Normal visible cervical spine. Normal bone marrow signal. Sinuses/Orbits: Negative. Other: Paranasal sinuses and mastoids are clear. Visible internal auditory structures appear normal. Scalp and face soft tissues appear negative. IMPRESSION: Normal noncontrast MRI appearance of the brain. Electronically Signed   By: Odessa Fleming M.D.   On: 02/08/2020 14:16   MR CERVICAL SPINE WO CONTRAST  Result Date: 02/09/2020 CLINICAL DATA:  Episode  of arm numbness 24 hours ago. EXAM: MRI CERVICAL SPINE WITHOUT CONTRAST TECHNIQUE: Multiplanar, multisequence MR imaging of the cervical spine was performed. No intravenous contrast was administered. COMPARISON:  None. FINDINGS: Alignment: Normal Vertebrae: Normal marrow signal. No bone lesions or fractures. Cord: Normal cord signal intensity. No cord lesions or syrinx. Posterior Fossa, vertebral arteries, paraspinal tissues: No significant  findings. Disc levels: C2-3: No significant findings. C3-4: No significant findings. C4-5: No significant findings. C5-6: Small central disc protrusion with mild impression on the ventral thecal sac. No direct neural compression. No foraminal stenosis. C6-7: Shallow disc protrusion with slight central focality. Mild impression on the ventral thecal sac but no direct neural compression. There is also a E shallow right foraminal disc osteophyte complex with right foraminal stenosis and probable impingement on the right C7 nerve root. C7-T1: No significant findings. IMPRESSION: 1. Small central disc protrusion at C5-6. No significant neural compression. 2. Shallow disc protrusion at C6-7 with slight central focality. There is also a shallow right foraminal disc osteophyte complex with foraminal narrowing and probable impingement on the right C7 nerve root. 3. Normal appearance of the cervical spinal cord. Electronically Signed   By: Rudie Meyer M.D.   On: 02/09/2020 10:01   DG Chest Portable 1 View  Result Date: 02/08/2020 CLINICAL DATA:  Episode of chest pain this morning. EXAM: PORTABLE CHEST 1 VIEW COMPARISON:  None. FINDINGS: The cardiac silhouette, mediastinal and hilar contours are normal. The lungs are clear. No pleural effusion or pneumothorax. No pulmonary lesions. The bony thorax is intact. IMPRESSION: No acute cardiopulmonary findings. Electronically Signed   By: Rudie Meyer M.D.   On: 02/08/2020 09:23   ECHOCARDIOGRAM COMPLETE  Result Date: 02/08/2020    ECHOCARDIOGRAM REPORT   Patient Name:   Derrick Young Date of Exam: 02/08/2020 Medical Rec #:  [id number]       Height:       70.0 in Accession #:    [id number]      Weight:       150.0 lb Date of Birth:  1966/09/24        BSA:          1.847 m Patient Age:    53 years        BP:           92/62 mmHg Patient Gender: M               HR:           54 bpm. Exam Location:  [facility] Procedure: 2D Echo, Cardiac Doppler and Color Doppler Indications:     I21.3  STEMI  History:         Patient has no prior history of Echocardiogram examinations.                  Risk Factors:Current Smoker.  Sonographer:     Sedonia Small Rodgers-Jones Referring Phys:  [id number] AGBATA Diagnosing Phys: [name] MD IMPRESSIONS  1. Left ventricular ejection fraction, by estimation, is 65 to 70%. The left ventricle has normal function. The left ventricle has no regional wall motion abnormalities. Left ventricular diastolic parameters were normal.  2. Right ventricular systolic function is normal. The right ventricular size is normal. There is normal pulmonary artery systolic pressure.  3. The mitral valve is normal in structure and function. No evidence of mitral valve regurgitation.  4. The aortic valve is normal in structure and function. Aortic valve regurgitation is not visualized. FINDINGS  Left Ventricle: Left ventricular ejection fraction, by estimation, is 65 to 70%. The left ventricle has normal function. The left ventricle has no regional wall motion abnormalities. The left ventricular internal cavity size was normal in size. There is  no left ventricular hypertrophy. Left ventricular diastolic parameters were normal. Right Ventricle: The right ventricular size is normal. No increase in right ventricular wall thickness. Right ventricular systolic function is normal. There is normal pulmonary artery systolic pressure. The tricuspid regurgitant velocity is 1.45 m/s, and  with an assumed right atrial pressure of 10 mmHg, the estimated right ventricular systolic pressure is 18.4 mmHg. Left Atrium: Left atrial size was normal in size. Right Atrium: Right atrial size was normal in size. Pericardium: There is no evidence of pericardial effusion. Mitral Valve: The mitral valve is normal in structure and function. No evidence of mitral valve regurgitation. Tricuspid Valve: The tricuspid valve is normal in structure. Tricuspid valve regurgitation is trivial. Aortic Valve: The aortic  valve is normal in structure and function. Aortic valve regurgitation is not visualized. Pulmonic Valve: The pulmonic valve was normal in structure. Pulmonic valve regurgitation is not visualized. Aorta: The aortic root is normal in size and structure. IAS/Shunts: The atrial septum is grossly normal.  LEFT VENTRICLE PLAX 2D LVIDd:         4.45 cm  Diastology LVIDs:         2.70 cm  LV e' lateral:   13.30 cm/s LV PW:         0.85 cm  LV E/e' lateral: 3.5 LV IVS:        0.93 cm  LV e' medial:    8.70 cm/s LVOT diam:     2.00 cm  LV E/e' medial:  5.4 LV SV:         53 LV SV Index:   29 LVOT Area:     3.14 cm  RIGHT VENTRICLE RV Basal diam:  4.44 cm RV S prime:     12.50 cm/s TAPSE (M-mode): 1.6 cm LEFT ATRIUM             Index       RIGHT ATRIUM           Index LA diam:        3.30 cm 1.79 cm/m  RA Area:     13.50 cm LA Vol (A2C):   46.9 ml 25.39 ml/m RA Volume:   35.80 ml  19.38 ml/m LA Vol (A4C):   26.8 ml 14.51 ml/m LA Biplane Vol: 36.1 ml 19.54 ml/m  AORTIC VALVE LVOT Vmax:   93.80 cm/s LVOT Vmean:  63.700 cm/s LVOT VTI:    0.169 m  AORTA Ao Root diam: 3.00 cm MITRAL VALVE               TRICUSPID VALVE MV Area (PHT): 4.31 cm    TR Peak grad:   8.4 mmHg MV Decel Time: 176 msec    TR Vmax:        145.00 cm/s MV E velocity: 47.10 cm/s MV A velocity: 45.40 cm/s  SHUNTS MV E/A ratio:  1.04        Systemic VTI:  0.17 m                            Systemic Diam: 2.00 cm [name] MD Electronically signed by [name] MD Signature Date/Time: 02/08/2020/8:26:27 PM    Final  Medications:  I have reviewed the patient's current medications. Scheduled: . aspirin EC  81 mg Oral Daily  . atorvastatin  40 mg Oral q1800  . cholecalciferol  1,000 Units Oral Daily    Assessment/Plan: 54 y.o. male presenting with complaints of a short episode of left arm and face numbness that resolved spontaneously and has not recurred.  MRI of the brain personally reviewed and shows no acute changes.  MRI of the  cervical spine shows no abnormality that would be referable to patient's presenting complaints. Significant for a central C5-5 protrusion with no neural compression.  Shallow disc protrusion noted at C7 with right foraminal narrowing noted. Carotid dopplers pending.  Echocardiogram shows no cardiac source of emboli with an EF of 65-70%.  A1c pending, LDL 127. Doubt presenting symptoms represent a TIA but remaining work up is pending.    Recommendations: 1. Carotid dopplers are pending 2. ASA 81mg  daily 3. Smoking cessation counseling 4. No further neurologic intervention is recommended at this time.  If further questions arise, please call or page at that time.  Thank you for allowing neurology to participate in the care of this patient.  Follow up with PCP on an outpatient basis.  Thana Farr, MD Neurology [phone] 02/09/2020  12:14 PM    LOS: 1 day   Thana Farr, MD Neurology [phone] 02/09/2020  12:07 PM

## 2020-02-09 NOTE — Progress Notes (Signed)
[facility] Cardiology    Patient Description: Derrick Young is a 54 year old male smoker with no significant past medical history who was admitted to the telemetry floor for further evaluation of chest pain, slightly abnormal EKG and possible TIA.  SUBJECTIVE: The patient reports to be feeling well on today and complains of generalized mild weakness in which he attributes to laying in bed for the past 24 hours.  The patient denies any symptoms of chest pain, left arm numbness, aphasia, unilateral weakness, palpitations or syncope or any other anginal symptoms at this time.   OBJECTIVE: The patient appears well groomed and without any signs of acute distress at this time.  Echocardiogram was unremarkable and revealed normal LV functioning and an EF< 65-70%. Patient is going for further MRI studies and a Myoview on today.    Vitals:   02/08/20 2011 02/09/20 0417 02/09/20 0739 02/09/20 0957  BP: 103/74 (!) 94/53 (!) 89/67 92/67  Pulse: (!) 58 60 60   Resp: 20 19 16    Temp: 97.8 F (36.6 C) 98 F (36.7 C) 97.9 F (36.6 C)   TempSrc: Oral Oral    SpO2: 100% 98% 95%   Weight:      Height:         Intake/Output Summary (Last 24 hours) at 02/09/2020 1040 Last data filed at 02/09/2020 0417 Gross per 24 hour  Intake 301.95 ml  Output 400 ml  Net -98.05 ml      PHYSICAL EXAM  General: Well developed, well nourished, in no acute distress HEENT:  Normocephalic and atramatic Neck:  No JVD.  Lungs: Clear bilaterally to auscultation, chest expansion symmetrical Heart: HRRR . Normal S1 and S2 without gallops or murmurs.  Abdomen: Bowel sounds are positive, abdomen soft and non-tender  Msk:  Back normal, Normal strength and tone for age. Extremities: No clubbing, cyanosis or edema.   Neuro: Alert and oriented X 3.  Psych:  Good affect, responds appropriately   LABS: Basic Metabolic Panel: Recent Labs    02/08/20 0856 02/09/20 0717  NA 140 138  K 4.0 4.0  CL 103 105  CO2 27 25  GLUCOSE  90 92  BUN 17 20  CREATININE 0.87 0.79  CALCIUM 9.6 9.2   Liver Function Tests: Recent Labs    02/08/20 0856  AST 18  ALT 15  ALKPHOS 47  BILITOT 0.8  PROT 7.2  ALBUMIN 4.2   Recent Labs    02/08/20 0856  LIPASE 22   CBC: Recent Labs    02/08/20 0856 02/09/20 0717  WBC 6.4 5.7  NEUTROABS 3.1  --   HGB 13.9 13.7  HCT 40.8 39.5  MCV 90.1 90.8  PLT 225 210   Cardiac Enzymes: No results for input(s): CKTOTAL, CKMB, CKMBINDEX, TROPONINI in the last 72 hours. BNP: Invalid input(s): POCBNP D-Dimer: No results for input(s): DDIMER in the last 72 hours. Hemoglobin A1C: No results for input(s): HGBA1C in the last 72 hours. Fasting Lipid Panel: Recent Labs    02/09/20 0717  CHOL 200  HDL 47  LDLCALC 127*  TRIG 132  CHOLHDL 4.3   Thyroid Function Tests: No results for input(s): TSH, T4TOTAL, T3FREE, THYROIDAB in the last 72 hours.  Invalid input(s): FREET3 Anemia Panel: No results for input(s): VITAMINB12, FOLATE, FERRITIN, TIBC, IRON, RETICCTPCT in the last 72 hours.  CT Head Wo Contrast  Result Date: 02/08/2020 CLINICAL DATA:  TIA.  Left side numbness EXAM: CT HEAD WITHOUT CONTRAST TECHNIQUE: Contiguous axial images were obtained from the  base of the skull through the vertex without intravenous contrast. COMPARISON:  None. FINDINGS: Brain: No acute intracranial abnormality. Specifically, no hemorrhage, hydrocephalus, mass lesion, acute infarction, or significant intracranial injury. Vascular: No hyperdense vessel or unexpected calcification. Skull: No acute calvarial abnormality. Sinuses/Orbits: Visualized paranasal sinuses and mastoids clear. Orbital soft tissues unremarkable. Other: None IMPRESSION: Normal study. Electronically Signed   By: Charlett Nose M.D.   On: 02/08/2020 09:51   MR BRAIN WO CONTRAST  Result Date: 02/08/2020 CLINICAL DATA:  54 year old male with sudden onset left side numbness last night lasting about an hour. EXAM: MRI HEAD WITHOUT CONTRAST  TECHNIQUE: Multiplanar, multiecho pulse sequences of the brain and surrounding structures were obtained without intravenous contrast. COMPARISON:  Head CT this morning. FINDINGS: Brain: No restricted diffusion to suggest acute infarction. No midline shift, mass effect, evidence of mass lesion, ventriculomegaly, extra-axial collection or acute intracranial hemorrhage. Cervicomedullary junction and pituitary are within normal limits. Normal cerebral volume. Wallace Cullens and Jacklin Zwick matter signal is within normal limits for age throughout the brain. No encephalomalacia or chronic cerebral blood products identified. Vascular: Major intracranial vascular flow voids are preserved. Skull and upper cervical spine: Normal visible cervical spine. Normal bone marrow signal. Sinuses/Orbits: Negative. Other: Paranasal sinuses and mastoids are clear. Visible internal auditory structures appear normal. Scalp and face soft tissues appear negative. IMPRESSION: Normal noncontrast MRI appearance of the brain. Electronically Signed   By: Odessa Fleming M.D.   On: 02/08/2020 14:16   MR CERVICAL SPINE WO CONTRAST  Result Date: 02/09/2020 CLINICAL DATA:  Episode of arm numbness 24 hours ago. EXAM: MRI CERVICAL SPINE WITHOUT CONTRAST TECHNIQUE: Multiplanar, multisequence MR imaging of the cervical spine was performed. No intravenous contrast was administered. COMPARISON:  None. FINDINGS: Alignment: Normal Vertebrae: Normal marrow signal. No bone lesions or fractures. Cord: Normal cord signal intensity. No cord lesions or syrinx. Posterior Fossa, vertebral arteries, paraspinal tissues: No significant findings. Disc levels: C2-3: No significant findings. C3-4: No significant findings. C4-5: No significant findings. C5-6: Small central disc protrusion with mild impression on the ventral thecal sac. No direct neural compression. No foraminal stenosis. C6-7: Shallow disc protrusion with slight central focality. Mild impression on the ventral thecal sac but  no direct neural compression. There is also a E shallow right foraminal disc osteophyte complex with right foraminal stenosis and probable impingement on the right C7 nerve root. C7-T1: No significant findings. IMPRESSION: 1. Small central disc protrusion at C5-6. No significant neural compression. 2. Shallow disc protrusion at C6-7 with slight central focality. There is also a shallow right foraminal disc osteophyte complex with foraminal narrowing and probable impingement on the right C7 nerve root. 3. Normal appearance of the cervical spinal cord. Electronically Signed   By: Rudie Meyer M.D.   On: 02/09/2020 10:01   DG Chest Portable 1 View  Result Date: 02/08/2020 CLINICAL DATA:  Episode of chest pain this morning. EXAM: PORTABLE CHEST 1 VIEW COMPARISON:  None. FINDINGS: The cardiac silhouette, mediastinal and hilar contours are normal. The lungs are clear. No pleural effusion or pneumothorax. No pulmonary lesions. The bony thorax is intact. IMPRESSION: No acute cardiopulmonary findings. Electronically Signed   By: Rudie Meyer M.D.   On: 02/08/2020 09:23   ECHOCARDIOGRAM COMPLETE  Result Date: 02/08/2020    ECHOCARDIOGRAM REPORT   Patient Name:   Derrick Young Date of Exam: 02/08/2020 Medical Rec #:  [id number]       Height:       70.0 in Accession #:  [id number]      Weight:       150.0 lb Date of Birth:  Apr 28, 1966        BSA:          1.847 m Patient Age:    53 years        BP:           92/62 mmHg Patient Gender: M               HR:           54 bpm. Exam Location:  [facility] Procedure: 2D Echo, Cardiac Doppler and Color Doppler Indications:     I21.3 STEMI  History:         Patient has no prior history of Echocardiogram examinations.                  Risk Factors:Current Smoker.  Sonographer:     Sedonia Small Rodgers-Jones Referring Phys:  [id number] AGBATA Diagnosing Phys: [name] MD IMPRESSIONS  1. Left ventricular ejection fraction, by estimation, is 65 to 70%. The left ventricle has  normal function. The left ventricle has no regional wall motion abnormalities. Left ventricular diastolic parameters were normal.  2. Right ventricular systolic function is normal. The right ventricular size is normal. There is normal pulmonary artery systolic pressure.  3. The mitral valve is normal in structure and function. No evidence of mitral valve regurgitation.  4. The aortic valve is normal in structure and function. Aortic valve regurgitation is not visualized. FINDINGS  Left Ventricle: Left ventricular ejection fraction, by estimation, is 65 to 70%. The left ventricle has normal function. The left ventricle has no regional wall motion abnormalities. The left ventricular internal cavity size was normal in size. There is  no left ventricular hypertrophy. Left ventricular diastolic parameters were normal. Right Ventricle: The right ventricular size is normal. No increase in right ventricular wall thickness. Right ventricular systolic function is normal. There is normal pulmonary artery systolic pressure. The tricuspid regurgitant velocity is 1.45 m/s, and  with an assumed right atrial pressure of 10 mmHg, the estimated right ventricular systolic pressure is 18.4 mmHg. Left Atrium: Left atrial size was normal in size. Right Atrium: Right atrial size was normal in size. Pericardium: There is no evidence of pericardial effusion. Mitral Valve: The mitral valve is normal in structure and function. No evidence of mitral valve regurgitation. Tricuspid Valve: The tricuspid valve is normal in structure. Tricuspid valve regurgitation is trivial. Aortic Valve: The aortic valve is normal in structure and function. Aortic valve regurgitation is not visualized. Pulmonic Valve: The pulmonic valve was normal in structure. Pulmonic valve regurgitation is not visualized. Aorta: The aortic root is normal in size and structure. IAS/Shunts: The atrial septum is grossly normal.  LEFT VENTRICLE PLAX 2D LVIDd:         4.45 cm   Diastology LVIDs:         2.70 cm  LV e' lateral:   13.30 cm/s LV PW:         0.85 cm  LV E/e' lateral: 3.5 LV IVS:        0.93 cm  LV e' medial:    8.70 cm/s LVOT diam:     2.00 cm  LV E/e' medial:  5.4 LV SV:         53 LV SV Index:   29 LVOT Area:     3.14 cm  RIGHT VENTRICLE RV Basal diam:  4.44 cm RV  S prime:     12.50 cm/s TAPSE (M-mode): 1.6 cm LEFT ATRIUM             Index       RIGHT ATRIUM           Index LA diam:        3.30 cm 1.79 cm/m  RA Area:     13.50 cm LA Vol (A2C):   46.9 ml 25.39 ml/m RA Volume:   35.80 ml  19.38 ml/m LA Vol (A4C):   26.8 ml 14.51 ml/m LA Biplane Vol: 36.1 ml 19.54 ml/m  AORTIC VALVE LVOT Vmax:   93.80 cm/s LVOT Vmean:  63.700 cm/s LVOT VTI:    0.169 m  AORTA Ao Root diam: 3.00 cm MITRAL VALVE               TRICUSPID VALVE MV Area (PHT): 4.31 cm    TR Peak grad:   8.4 mmHg MV Decel Time: 176 msec    TR Vmax:        145.00 cm/s MV E velocity: 47.10 cm/s MV A velocity: 45.40 cm/s  SHUNTS MV E/A ratio:  1.04        Systemic VTI:  0.17 m                            Systemic Diam: 2.00 cm Dwayne Prince Rome MD Electronically signed by Yolonda Kida MD Signature Date/Time: 02/08/2020/8:26:27 PM    Final      Echocardiogram: Unremarkable, normal LV function, EF >65 to 70%.  TELEMETRY: NSR  ASSESSMENT AND PLAN:  Active Problems:   TIA (transient ischemic attack)  Chest pain Evaluate for angina Abnormal EKG Smoking  PLAN: - Possible TIA  -In agreement with Neurology evaluation for possible TIA  -Recommend beta-blockade therapy, Ace inhibitor and Aspirin short-term until work-up completed      -Chest Pain, resolved, patient denies chest pain at this time, Echocardiogram was benign with normal LV function, no wall motion abnormality noted and EF >65 to 70%  -Functional study ETT/Myoview ordered for evaluation of possible ischemia  -continue telemetry monitoring  - Follow-up EKG and troponins  -We continue to recommend conservative cardiac input at this  point  - Abnormal EKG  -Follow up EKG on today look the same as initial EKG   -Functional study ETT/Myoview ordered for evaluation of possible ischemia  -Tobacco use  - Recommend Nicotine patch as needed.  -Educated patient on the importance of tobacco cessation     Discussed with Dr. Clayborn Bigness who also evaluated the patient and the plan was made in collaboration with him.    Ventura Hollenbeck, ACNPC-AG  02/09/2020 10:40 AM

## 2020-02-12 ENCOUNTER — Telehealth: Payer: Self-pay | Admitting: Family Medicine

## 2020-02-12 NOTE — Telephone Encounter (Signed)
[employer] may not be eligible for TCM visit

## 2020-02-17 ENCOUNTER — Ambulatory Visit: Payer: BC Managed Care – PPO | Admitting: Family Medicine

## 2020-02-17 ENCOUNTER — Other Ambulatory Visit: Payer: Self-pay

## 2020-02-17 ENCOUNTER — Encounter: Payer: Self-pay | Admitting: Family Medicine

## 2020-02-17 VITALS — BP 118/70 | HR 72 | Temp 97.9°F | Ht 70.0 in | Wt 157.2 lb

## 2020-02-17 DIAGNOSIS — F17200 Nicotine dependence, unspecified, uncomplicated: Secondary | ICD-10-CM | POA: Diagnosis not present

## 2020-02-17 DIAGNOSIS — R202 Paresthesia of skin: Secondary | ICD-10-CM

## 2020-02-17 DIAGNOSIS — G459 Transient cerebral ischemic attack, unspecified: Secondary | ICD-10-CM

## 2020-02-17 DIAGNOSIS — R0789 Other chest pain: Secondary | ICD-10-CM

## 2020-02-17 DIAGNOSIS — G542 Cervical root disorders, not elsewhere classified: Secondary | ICD-10-CM

## 2020-02-17 DIAGNOSIS — E785 Hyperlipidemia, unspecified: Secondary | ICD-10-CM

## 2020-02-17 LAB — FOLATE: Folate: 14.1 ng/mL (ref >5.9)

## 2020-02-17 LAB — VITAMIN B12: Vitamin B-12: 466 pg/mL (ref 211–911)

## 2020-02-17 NOTE — Assessment & Plan Note (Signed)
Mild. Now off statin.  The ASCVD Risk score Denman George DC Jr., et al., 2013) failed to calculate for the following reasons:   Unable to determine if patient is Non-Hispanic African American

## 2020-02-17 NOTE — Assessment & Plan Note (Signed)
Reviewed recent MRI with patient - he is largely asymptomatic from this, it doesn't explain his symptoms.

## 2020-02-17 NOTE — Progress Notes (Signed)
This visit was conducted in person.  BP 118/70 (BP Location: Left Arm, Patient Position: Sitting, Cuff Size: Normal)   Pulse 72   Temp 97.9 F (36.6 C) (Temporal)   Ht 5\' 10"  (1.778 m)   Wt 157 lb 3 oz (71.3 kg)   SpO2 97%   BMI 22.55 kg/m    CC: hospital f/u visit  Subjective:    Patient ID: Derrick Young, male    DOB: 02-07-1966, 54 y.o.   MRN: [id number]  HPI: Derrick Young is a 54 y.o. male presenting on 02/17/2020 for Hospitalization Follow-up   Recent hospitalization for acute onset of left hand numbness with associated numbness at left side of face. He also had left chest discomfort/pressure. He was ruled out for acute stroke with head CT and brain MRI and for acute MI with cycled cardiac enzymes. He also had echocardiogram (normal) and carotid ultrasound (normal) and stress test.  Paresthesias started at L hand then he felt L facial numbness. No slurred speech, vision changes, unilateral weakness, headache. He did feel nauseated after episode.   Stress test: Negative lexiscan stress LV function normal Low risk study  MRI cervical spine showed small central disc protrusion at C5/6 without cord compression and foraminal narrowing with possible impingement on R C7 nerve root (02/2020).   Aspiring and atorvastatin were started.  Saw neurology and cardiology.   Has cut back on vaping. Quit cigarettes 2 yrs ago.  Planned f/u with cardiology Clayborn Bigness)    [place] date: 02/08/2020 Discharge date: 02/09/2020 [facility] - not eligible for TCM)  Admitted From: Home Disposition: Home  Recommendations for Outpatient Follow-up:  1. Follow up with PCP in 1 week 2. Follow-up with cardiology (Dr. Clayborn Bigness) in 1-2 weeks  Home Health: None Equipment/Devices:none Discharge Condition: Fair CODE STATUS: Full code Diet recommendation: Heart Healthy  Procedure: CT head, MRI brain, MRI cervical spine, 2D echo, carotid ultrasound, Lexiscan Myoview  Discharge Diagnoses:  Principal  Problem:   TIA (transient ischemic attack) Active Problems:   Smoker   Dyslipidemia   Angina pectoris ([place])     Relevant past medical, surgical, family and social history reviewed and updated as indicated. Interim medical history since our last visit reviewed. Allergies and medications reviewed and updated. Outpatient Medications Prior to Visit  Medication Sig Dispense Refill  . aspirin EC 81 MG EC tablet Take 1 tablet (81 mg total) by mouth daily. 30 tablet 1  . atorvastatin (LIPITOR) 40 MG tablet Take 1 tablet (40 mg total) by mouth daily at 6 PM. 30 tablet 1  . cholecalciferol (VITAMIN D3) 25 MCG (1000 UNIT) tablet Take 1,000 Units by mouth daily.    Marland Kitchen ibuprofen (ADVIL,MOTRIN) 200 MG tablet Take 200 mg by mouth every 6 (six) hours as needed.    . Multiple Vitamins-Minerals (AIRBORNE PO) Take 1 tablet by mouth daily.    . nitroGLYCERIN (NITROSTAT) 0.4 MG SL tablet Place 1 tablet (0.4 mg total) under the tongue every 5 (five) minutes x 3 doses as needed for chest pain. 30 tablet 1  . zinc gluconate 50 MG tablet Take 50 mg by mouth daily.     No facility-administered medications prior to visit.     Per HPI unless specifically indicated in ROS section below Review of Systems Objective:    BP 118/70 (BP Location: Left Arm, Patient Position: Sitting, Cuff Size: Normal)   Pulse 72   Temp 97.9 F (36.6 C) (Temporal)   Ht 5\' 10"  (1.778 m)   Wt 157 lb  3 oz (71.3 kg)   SpO2 97%   BMI 22.55 kg/m   Wt Readings from Last 3 Encounters:  02/17/20 157 lb 3 oz (71.3 kg)  02/08/20 150 lb (68 kg)  09/02/19 145 lb 7 oz (66 kg)    Physical Exam Vitals and nursing note reviewed.  Constitutional:      Appearance: Normal appearance. He is not ill-appearing.  HENT:     Head: Normocephalic and atraumatic.  Eyes:     General:        Right eye: No discharge.        Left eye: No discharge.     Extraocular Movements: Extraocular movements intact.     Conjunctiva/sclera: Conjunctivae normal.       Pupils: Pupils are equal, round, and reactive to light.  Neck:     Thyroid: No thyromegaly or thyroid tenderness.  Cardiovascular:     Rate and Rhythm: Normal rate and regular rhythm.     Pulses: Normal pulses.     Heart sounds: Normal heart sounds. No murmur.  Pulmonary:     Effort: Pulmonary effort is normal. No respiratory distress.     Breath sounds: Normal breath sounds. No wheezing, rhonchi or rales.  Musculoskeletal:     Right lower leg: No edema.     Left lower leg: No edema.  Skin:    General: Skin is warm and dry.     Findings: No rash.  Neurological:     General: No focal deficit present.     Mental Status: He is alert.     Sensory: Sensation is intact.     Motor: Motor function is intact.     Coordination: Coordination is intact.     Comments: CN grossly intact, sensation of face and BUE intact, coordination intact  Psychiatric:        Mood and Affect: Mood normal.        Behavior: Behavior normal.       Assessment & Plan:  This visit occurred during the SARS-CoV-2 public health emergency.  Safety protocols were in place, including screening questions prior to the visit, additional usage of staff PPE, and extensive cleaning of exam room while observing appropriate contact time as indicated for disinfecting solutions.   Problem List Items Addressed This Visit    TIA (transient ischemic attack) - Primary    Presumed TIA however workup largely reassuring.  Will continue statin and aspirin at this time.  Check B12, folate to r/o other cause of symptoms.  Await cards eval - r/o arrhythmia.       Smoker    He is working on quitting E-cigarettes      Dyslipidemia    Mild. Now off statin.  The ASCVD Risk score Denman George DC Jr., et al., 2013) failed to calculate for the following reasons:   Unable to determine if patient is Non-Hispanic African American       Chest discomfort    S/p reassuring cardiac evaluation. He does have cards f/u planned later this month  ([place]). Will continue statin for now.       Cervical nerve root impingement    Reviewed recent MRI with patient - he is largely asymptomatic from this, it doesn't explain his symptoms.        Other Visit Diagnoses    Paresthesias       Relevant Orders   Vitamin B12   Folate       No orders of the defined types were placed in  this encounter.  Orders Placed This Encounter  Procedures  . Vitamin B12  . Folate    Patient Instructions  Labs today.  Continue current medicines.  Return as needed or for next physical in the fall.    Follow up plan: Return if symptoms worsen or fail to improve.  [name], MD

## 2020-02-17 NOTE — Assessment & Plan Note (Signed)
S/p reassuring cardiac evaluation. He does have cards f/u planned later this month ([place]). Will continue statin for now.

## 2020-02-17 NOTE — Assessment & Plan Note (Signed)
He is working on quitting E-cigarettes

## 2020-02-17 NOTE — Patient Instructions (Addendum)
Labs today.  Continue current medicines.  Return as needed or for next physical in the fall.

## 2020-02-17 NOTE — Assessment & Plan Note (Addendum)
Presumed TIA however workup largely reassuring.  Will continue statin and aspirin at this time.  Check B12, folate to r/o other cause of symptoms.  Await cards eval - r/o arrhythmia.

## 2020-02-22 DIAGNOSIS — F17200 Nicotine dependence, unspecified, uncomplicated: Secondary | ICD-10-CM | POA: Diagnosis not present

## 2020-02-22 DIAGNOSIS — R0789 Other chest pain: Secondary | ICD-10-CM | POA: Diagnosis not present

## 2020-02-22 DIAGNOSIS — E785 Hyperlipidemia, unspecified: Secondary | ICD-10-CM | POA: Diagnosis not present

## 2020-02-22 DIAGNOSIS — G459 Transient cerebral ischemic attack, unspecified: Secondary | ICD-10-CM | POA: Diagnosis not present

## 2020-05-09 ENCOUNTER — Telehealth: Payer: Self-pay

## 2020-05-09 NOTE — Telephone Encounter (Addendum)
Spoke with pt and moved appt to tomorrow at 11:15.  FYI to Dr. Reece Agar.

## 2020-05-09 NOTE — Telephone Encounter (Signed)
Starting on 05/07/20 pt had bright red blood on toilet tissue; no blood in commode; today pt still has bright red blood on toilet tissue with no blood in commode. On 05/07/20 and 05/08/20 pt had upper abd discomfort, no abd pain, constipation, or diarrhea. Pt had similar symptoms 2-3 yrs ago; pt is not sure if needs colonoscopy. No N&V.Pt has no covid symptoms, no travel and no known exposure to + covid. Pt has had rt sided pain and pain in penis due to kidney stone and pt has appt with urologist next wk. Pt only wants to see Dr Reece Agar. Pt scheduled appt with Dr Reece Agar on 05/11/20 at 12 Noon. UC & ED precautions given and pt voiced understanding. Note to DR G for review.

## 2020-05-09 NOTE — Telephone Encounter (Signed)
I have opening tomorrow am - can he come then?

## 2020-05-10 ENCOUNTER — Encounter: Payer: Self-pay | Admitting: Family Medicine

## 2020-05-10 ENCOUNTER — Ambulatory Visit: Payer: BC Managed Care – PPO | Admitting: Family Medicine

## 2020-05-10 ENCOUNTER — Other Ambulatory Visit: Payer: Self-pay

## 2020-05-10 DIAGNOSIS — K625 Hemorrhage of anus and rectum: Secondary | ICD-10-CM

## 2020-05-10 DIAGNOSIS — B360 Pityriasis versicolor: Secondary | ICD-10-CM | POA: Diagnosis not present

## 2020-05-10 MED ORDER — FLUCONAZOLE 150 MG PO TABS: 300.0000 mg | ORAL_TABLET | ORAL | 0 refills | Status: DC

## 2020-05-10 MED ORDER — HYDROCORTISONE (PERIANAL) 2.5 % EX CREA: 1.0000 "application " | TOPICAL_CREAM | Freq: Two times a day (BID) | CUTANEOUS | 0 refills | Status: DC

## 2020-05-10 NOTE — Progress Notes (Signed)
This visit was conducted in person.  BP 106/68 (BP Location: Left Arm, Patient Position: Sitting, Cuff Size: Normal)   Pulse 74   Temp 97.9 F (36.6 C) (Temporal)   Ht 5\' 10"  (1.778 m)   Wt 153 lb 2 oz (69.5 kg)   SpO2 97%   BMI 21.97 kg/m   BP Readings from Last 3 Encounters:  05/10/20 106/68  02/17/20 118/70  02/09/20 96/65    CC: rectal bleeding Subjective:    Patient ID: Derrick Young, male    DOB: 1966/04/30, 54 y.o.   MRN: [id number]  HPI: Derrick Young is a 54 y.o. male presenting on 05/10/2020 for Rectal Bleeding (C/o seeing blood when wiping.  Noticed 05/07/20 and again yesterday.  Has had similiar episode in past, a few times. )   1 year off and on h/o rectal bleeding s/p benign colonoscopy. Bleeding restarted Monday morning - BRB when wiping, more than normal, again recurred yesterday. Also had associated abd bloating, gassiness, looser stools. No constipation. Hasn't noted external hemorrhoid. Denies fevers/chills, abd pain, nausea/vomiting, GERD or heartburn symptoms. No clots. No pain or itching.  Last Friday noted burning discomfort associated with R flank pain. H/o kidney stones, last 2014 s/p lithotripsy. No h/o prostate infections in the past. Took left over hydrocodone and symptoms have largely resolved. To see urology next week.   Just finished Ramadan 3 wks ago, was constipated during this time. Regular use of soup that causes constipation.   COLONOSCOPY 12/2019 - benign polyp, rpt 10 yrs [facility])  Presumed TIA 02/2020 - continues aspirin 81mg  and atorvastatin 40mg  daily.   Requests diflucan refill for tinea versicolor which as recurred this spring.      Relevant past medical, surgical, family and social history reviewed and updated as indicated. Interim medical history since our last visit reviewed. Allergies and medications reviewed and updated. Outpatient Medications Prior to Visit  Medication Sig Dispense Refill  . aspirin EC 81 MG EC tablet Take 1  tablet (81 mg total) by mouth daily. 30 tablet 1  . atorvastatin (LIPITOR) 40 MG tablet Take 1 tablet (40 mg total) by mouth daily at 6 PM. 30 tablet 1  . cholecalciferol (VITAMIN D3) 25 MCG (1000 UNIT) tablet Take 1,000 Units by mouth daily.    Marland Kitchen ibuprofen (ADVIL,MOTRIN) 200 MG tablet Take 200 mg by mouth every 6 (six) hours as needed.    . Multiple Vitamins-Minerals (AIRBORNE PO) Take 1 tablet by mouth daily.    . nitroGLYCERIN (NITROSTAT) 0.4 MG SL tablet Place 1 tablet (0.4 mg total) under the tongue every 5 (five) minutes x 3 doses as needed for chest pain. 30 tablet 1  . zinc gluconate 50 MG tablet Take 50 mg by mouth daily.     No facility-administered medications prior to visit.     Per HPI unless specifically indicated in ROS section below Review of Systems Objective:  BP 106/68 (BP Location: Left Arm, Patient Position: Sitting, Cuff Size: Normal)   Pulse 74   Temp 97.9 F (36.6 C) (Temporal)   Ht 5\' 10"  (1.778 m)   Wt 153 lb 2 oz (69.5 kg)   SpO2 97%   BMI 21.97 kg/m   Wt Readings from Last 3 Encounters:  05/10/20 153 lb 2 oz (69.5 kg)  02/17/20 157 lb 3 oz (71.3 kg)  02/08/20 150 lb (68 kg)      Physical Exam Vitals and nursing note reviewed.  Constitutional:      Appearance: Normal appearance. He  is not ill-appearing.  Cardiovascular:     Rate and Rhythm: Normal rate and regular rhythm.     Pulses: Normal pulses.     Heart sounds: Normal heart sounds. No murmur.  Pulmonary:     Effort: Pulmonary effort is normal. No respiratory distress.     Breath sounds: Normal breath sounds. No wheezing, rhonchi or rales.  Abdominal:     General: Abdomen is flat. Bowel sounds are normal. There is no distension.     Palpations: Abdomen is soft. There is no mass.     Tenderness: There is no abdominal tenderness. There is no right CVA tenderness, left CVA tenderness, guarding or rebound.     Hernia: No hernia is present.  Genitourinary:    Prostate: Normal. Not enlarged, not  tender and no nodules present.     Rectum: Internal hemorrhoid present. No mass, tenderness, anal fissure or external hemorrhoid. Normal anal tone.     Comments:  Perianal tissue swelling noted on right without obvious prolapsed internal hemorrhoid No external hemorrhoid Musculoskeletal:     Right lower leg: No edema.     Left lower leg: No edema.  Skin:    General: Skin is warm and dry.     Findings: Rash (hyperpigmented macular rash throughout back) present.  Neurological:     Mental Status: He is alert.  Psychiatric:        Mood and Affect: Mood normal.        Behavior: Behavior normal.       Results for orders placed or performed in visit on 02/17/20  Vitamin B12  Result Value Ref Range   Vitamin B-12 466 211 - 911 pg/mL  Folate  Result Value Ref Range   Folate 14.1 >5.9 ng/mL   Assessment & Plan:  This visit occurred during the SARS-CoV-2 public health emergency.  Safety protocols were in place, including screening questions prior to the visit, additional usage of staff PPE, and extensive cleaning of exam room while observing appropriate contact time as indicated for disinfecting solutions.   Problem List Items Addressed This Visit    Tinea versicolor    Recurrent - refill diflucan.  Discussed topical nizoral or selsun blue shampoo for maintenance.       BRBPR (bright red blood per rectum)    Recurrent, s/p reassuring colonoscopy 12/2019.  Exam today with evidence of left sided perianal soft tissue swelling anticipate flared L internal hemorrhoid after constipation during Ramadan. Discussed measures to prevent constipation. Rx anusol HC cream BID for 1-2 wks. Update if ineffective to control bleeding.           Meds ordered this encounter  Medications  . hydrocortisone (ANUSOL-HC) 2.5 % rectal cream    Sig: Place 1 application rectally 2 (two) times daily.    Dispense:  30 g    Refill:  0  . fluconazole (DIFLUCAN) 150 MG tablet    Sig: Take 2 tablets (300 mg  total) by mouth once a week. For 2 weeks    Dispense:  4 tablet    Refill:  0   No orders of the defined types were placed in this encounter.  Patient Instructions  I think recent constipation led to inflammation of internal hemorrhoid which caused the bleeding - this should improve over time. May use steroid cream sent to pharmacy as needed.  Let us know if not improving with this.     Follow up plan: Return if symptoms worsen or fail to improve.  Ria Bush, MD

## 2020-05-10 NOTE — Assessment & Plan Note (Addendum)
Recurrent - refill diflucan.  Discussed topical nizoral or selsun blue shampoo for maintenance.

## 2020-05-10 NOTE — Assessment & Plan Note (Signed)
Recurrent, s/p reassuring colonoscopy 12/2019.  Exam today with evidence of left sided perianal soft tissue swelling anticipate flared L internal hemorrhoid after constipation during Ramadan. Discussed measures to prevent constipation. Rx anusol HC cream BID for 1-2 wks. Update if ineffective to control bleeding.

## 2020-05-10 NOTE — Patient Instructions (Signed)
I think recent constipation led to inflammation of internal hemorrhoid which caused the bleeding - this should improve over time. May use steroid cream sent to pharmacy as needed.  Let us know if not improving with this.

## 2020-05-11 ENCOUNTER — Ambulatory Visit: Payer: BC Managed Care – PPO | Admitting: Family Medicine

## 2020-05-15 DIAGNOSIS — N23 Unspecified renal colic: Secondary | ICD-10-CM | POA: Diagnosis not present

## 2020-05-15 DIAGNOSIS — N201 Calculus of ureter: Secondary | ICD-10-CM | POA: Diagnosis not present

## 2020-12-29 ENCOUNTER — Other Ambulatory Visit: Payer: Self-pay | Admitting: Family Medicine

## 2020-12-29 DIAGNOSIS — E785 Hyperlipidemia, unspecified: Secondary | ICD-10-CM

## 2020-12-29 DIAGNOSIS — Z125 Encounter for screening for malignant neoplasm of prostate: Secondary | ICD-10-CM

## 2021-01-01 ENCOUNTER — Other Ambulatory Visit (INDEPENDENT_AMBULATORY_CARE_PROVIDER_SITE_OTHER): Payer: BC Managed Care – PPO

## 2021-01-01 ENCOUNTER — Other Ambulatory Visit: Payer: Self-pay

## 2021-01-01 DIAGNOSIS — E785 Hyperlipidemia, unspecified: Secondary | ICD-10-CM

## 2021-01-01 DIAGNOSIS — Z125 Encounter for screening for malignant neoplasm of prostate: Secondary | ICD-10-CM | POA: Diagnosis not present

## 2021-01-01 LAB — PSA: PSA: 0.79 ng/mL (ref 0.10–4.00)

## 2021-01-01 LAB — LIPID PANEL
Cholesterol: 195 mg/dL (ref 0–200)
HDL: 45.8 mg/dL (ref >39.00)
LDL Cholesterol: 120 mg/dL — ABNORMAL HIGH (ref 0–99)
NonHDL: 148.96
Total CHOL/HDL Ratio: 4
Triglycerides: 147 mg/dL (ref 0.0–149.0)
VLDL: 29.4 mg/dL (ref 0.0–40.0)

## 2021-01-01 LAB — COMPREHENSIVE METABOLIC PANEL
ALT: 10 U/L (ref 0–53)
AST: 12 U/L (ref 0–37)
Albumin: 4.4 g/dL (ref 3.5–5.2)
Alkaline Phosphatase: 53 U/L (ref 39–117)
BUN: 16 mg/dL (ref 6–23)
CO2: 30 mEq/L (ref 19–32)
Calcium: 9.5 mg/dL (ref 8.4–10.5)
Chloride: 105 mEq/L (ref 96–112)
Creatinine, Ser: 0.89 mg/dL (ref 0.40–1.50)
GFR: 97.92 mL/min (ref >60.00)
Glucose, Bld: 94 mg/dL (ref 70–99)
Potassium: 4 mEq/L (ref 3.5–5.1)
Sodium: 140 mEq/L (ref 135–145)
Total Bilirubin: 0.6 mg/dL (ref 0.2–1.2)
Total Protein: 6.9 g/dL (ref 6.0–8.3)

## 2021-01-01 LAB — TSH: TSH: 1.84 u[IU]/mL (ref 0.35–4.50)

## 2021-01-08 ENCOUNTER — Other Ambulatory Visit: Payer: Self-pay

## 2021-01-08 ENCOUNTER — Ambulatory Visit (INDEPENDENT_AMBULATORY_CARE_PROVIDER_SITE_OTHER): Payer: BC Managed Care – PPO | Admitting: Family Medicine

## 2021-01-08 ENCOUNTER — Encounter: Payer: Self-pay | Admitting: Family Medicine

## 2021-01-08 VITALS — BP 118/68 | HR 75 | Temp 97.6°F | Ht 69.0 in | Wt 162.2 lb

## 2021-01-08 DIAGNOSIS — Z0000 Encounter for general adult medical examination without abnormal findings: Secondary | ICD-10-CM | POA: Diagnosis not present

## 2021-01-08 DIAGNOSIS — F17200 Nicotine dependence, unspecified, uncomplicated: Secondary | ICD-10-CM

## 2021-01-08 DIAGNOSIS — G459 Transient cerebral ischemic attack, unspecified: Secondary | ICD-10-CM | POA: Diagnosis not present

## 2021-01-08 DIAGNOSIS — E785 Hyperlipidemia, unspecified: Secondary | ICD-10-CM

## 2021-01-08 MED ORDER — ATORVASTATIN CALCIUM 40 MG PO TABS: 40.0000 mg | ORAL_TABLET | Freq: Every evening | ORAL | 3 refills | Status: DC

## 2021-01-08 NOTE — Assessment & Plan Note (Signed)
Preventative protocols reviewed and updated unless pt declined. Discussed healthy diet and lifestyle.  

## 2021-01-08 NOTE — Patient Instructions (Addendum)
Ok to hold aspirin for now. Continue nightly statin.  We will refer you for lung cancer screening.  Consider shingrix vaccine - if interested call us to schedule nurse visit.  Let us know when you need refill of diflucan fore tinea versicolor.  Return as needed or in 1 year for next physical.  You are doing well today.   Health Maintenance, Male Adopting a healthy lifestyle and getting preventive care are important in promoting health and wellness. Ask your health care provider about:  The right schedule for you to have regular tests and exams.  Things you can do on your own to prevent diseases and keep yourself healthy. What should I know about diet, weight, and exercise? Eat a healthy diet  Eat a diet that includes plenty of vegetables, fruits, low-fat dairy products, and lean protein.  Do not eat a lot of foods that are high in solid fats, added sugars, or sodium.   Maintain a healthy weight Body mass index (BMI) is a measurement that can be used to identify possible weight problems. It estimates body fat based on height and weight. Your health care provider can help determine your BMI and help you achieve or maintain a healthy weight. Get regular exercise Get regular exercise. This is one of the most important things you can do for your health. Most adults should:  Exercise for at least 150 minutes each week. The exercise should increase your heart rate and make you sweat (moderate-intensity exercise).  Do strengthening exercises at least twice a week. This is in addition to the moderate-intensity exercise.  Spend less time sitting. Even light physical activity can be beneficial. Watch cholesterol and blood lipids Have your blood tested for lipids and cholesterol at 54 years of age, then have this test every 5 years. You may need to have your cholesterol levels checked more often if:  Your lipid or cholesterol levels are high.  You are older than 54 years of age.  You are at  high risk for heart disease. What should I know about cancer screening? Many types of cancers can be detected early and may often be prevented. Depending on your health history and family history, you may need to have cancer screening at various ages. This may include screening for:  Colorectal cancer.  Prostate cancer.  Skin cancer.  Lung cancer. What should I know about heart disease, diabetes, and high blood pressure? Blood pressure and heart disease  High blood pressure causes heart disease and increases the risk of stroke. This is more likely to develop in people who have high blood pressure readings, are of African descent, or are overweight.  Talk with your health care provider about your target blood pressure readings.  Have your blood pressure checked: ? Every 3-5 years if you are 78-31 years of age. ? Every year if you are 46 years old or older.  If you are between the ages of 32 and 88 and are a current or former smoker, ask your health care provider if you should have a one-time screening for abdominal aortic aneurysm (AAA). Diabetes Have regular diabetes screenings. This checks your fasting blood sugar level. Have the screening done:  Once every three years after age 14 if you are at a normal weight and have a low risk for diabetes.  More often and at a younger age if you are overweight or have a high risk for diabetes. What should I know about preventing infection? Hepatitis B If you have a  higher risk for hepatitis B, you should be screened for this virus. Talk with your health care provider to find out if you are at risk for hepatitis B infection. Hepatitis C Blood testing is recommended for:  Everyone born from [birth year] through 1964.  Anyone with known risk factors for hepatitis C. Sexually transmitted infections (STIs)  You should be screened each year for STIs, including gonorrhea and chlamydia, if: ? You are sexually active and are younger than 54 years of  age. ? You are older than 54 years of age and your health care provider tells you that you are at risk for this type of infection. ? Your sexual activity has changed since you were last screened, and you are at increased risk for chlamydia or gonorrhea. Ask your health care provider if you are at risk.  Ask your health care provider about whether you are at high risk for HIV. Your health care provider may recommend a prescription medicine to help prevent HIV infection. If you choose to take medicine to prevent HIV, you should first get tested for HIV. You should then be tested every 3 months for as long as you are taking the medicine. Follow these instructions at home: Lifestyle  Do not use any products that contain nicotine or tobacco, such as cigarettes, e-cigarettes, and chewing tobacco. If you need help quitting, ask your health care provider.  Do not use street drugs.  Do not share needles.  Ask your health care provider for help if you need support or information about quitting drugs. Alcohol use  Do not drink alcohol if your health care provider tells you not to drink.  If you drink alcohol: ? Limit how much you have to 0-2 drinks a day. ? Be aware of how much alcohol is in your drink. In the [place] one drink equals one 12 oz bottle of beer (355 mL), one 5 oz glass of wine (148 mL), or one 1 oz glass of hard liquor (44 mL). General instructions  Schedule regular health, dental, and eye exams.  Stay current with your vaccines.  Tell your health care provider if: ? You often feel depressed. ? You have ever been abused or do not feel safe at home. Summary  Adopting a healthy lifestyle and getting preventive care are important in promoting health and wellness.  Follow your health care provider's instructions about healthy diet, exercising, and getting tested or screened for diseases.  Follow your health care provider's instructions on monitoring your cholesterol and blood  pressure. This information is not intended to replace advice given to you by your health care provider. Make sure you discuss any questions you have with your health care provider. Document Revised: 11/17/2018 Document Reviewed: 11/17/2018 Elsevier Patient Education  2021 [employer].

## 2021-01-08 NOTE — Assessment & Plan Note (Addendum)
Reviewed last year's hospitalization.  He will start statin. Ok to hold aspirin at this time with low threshold to start if any neurological symptoms recur.

## 2021-01-08 NOTE — Assessment & Plan Note (Addendum)
Encouraged fully quitting E cigs Will refer for lung cancer screen

## 2021-01-08 NOTE — Assessment & Plan Note (Signed)
Chronic, mild. Will restart atorvastatin given ?h/o TIA 02/2020.  The ASCVD Risk score Denman George DC Jr., et al., 2013) failed to calculate for the following reasons:   The patient has a prior MI or stroke diagnosis

## 2021-01-08 NOTE — Progress Notes (Signed)
Patient ID: Derrick Young, male    DOB: [birth year], 55 y.o.   MRN: [id number]  This visit was conducted in person.  BP 118/68 (BP Location: Left Arm, Patient Position: Sitting, Cuff Size: Normal)   Pulse 75   Temp 97.6 F (36.4 C) (Temporal)   Ht 5\' 9"  (1.753 m)   Wt 162 lb 4 oz (73.6 kg)   SpO2 97%   BMI 23.96 kg/m    CC: CPE Subjective:   HPI: Derrick Young is a 55 y.o. male presenting on 01/08/2021 for Annual Exam   Hospitalization 02/2020 for TIA (transient L hand numbness, L face numbness) with negative head CT and brain MRI. Chest pain at that time evaluated with stress test: Negative lexiscan stress, LV function normal, low risk study.  MRI cervical spine showed small central disc protrusion at C5/6 without cord compression and foraminal narrowing with possible impingement on R C7 nerve root (02/2020).  Established with Dr 12-16-1986.  Started on aspirin and atorvastatin - actually stopped aspirin and takes atorvastatin infrequently.   Preventative: COLONOSCOPY 12/2019 - benign polyp, rpt 10 yrs [facility]) Prostate cancer screening - continue yearly screening with PSA  Fmhx lung cancer(father age 49), pt smoker. Desires lung cancer screening program. >30 PY hx. Flu shot yearly - declines  COVID vaccine Pfizer 07/2020 x2, booster 12/2020  Tdap 2016 Meningococcal 04/2018 shingrix - discussed. intertesed but will defer for now given recent COVID booster Seat belt use discussed. Sunscreen use discussed. No changing moles on the skin. Smoking -stopped 11/2017. Down to E-cig. 30+ PY hx.  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  Interim medical history since our last visit reviewed. Allergies and medications reviewed and updated. Outpatient Medications Prior to Visit  Medication Sig Dispense Refill  . nitroGLYCERIN (NITROSTAT) 0.4 MG SL tablet Place 1 tablet (0.4 mg total) under the tongue every 5 (five) minutes x 3 doses as needed for chest pain. 30 tablet 1  . aspirin EC 81 MG EC tablet Take 1 tablet (81 mg total) by mouth daily. 30 tablet 1  . cholecalciferol (VITAMIN D3) 25 MCG (1000 UNIT) tablet Take 1,000 Units by mouth daily.    . fluconazole (DIFLUCAN) 150 MG tablet Take 2 tablets (300 mg total) by mouth once a week. For 2 weeks 4 tablet 0  . hydrocortisone (ANUSOL-HC) 2.5 % rectal cream Place 1 application rectally 2 (two) times daily. 30 g 0  . ibuprofen (ADVIL,MOTRIN) 200 MG tablet Take 200 mg by mouth every 6 (six) hours as needed.    . Multiple Vitamins-Minerals (AIRBORNE PO) Take 1 tablet by mouth daily.    Personal assistant zinc gluconate 50 MG tablet Take 50 mg by mouth daily.    Marland Kitchen atorvastatin (LIPITOR) 40 MG tablet Take 1 tablet (40 mg total) by mouth daily at 6 PM. (Patient not taking: Reported on 01/08/2021) 30 tablet 1   No facility-administered medications prior to visit.     Per HPI unless specifically indicated in ROS section below Review of Systems  Constitutional: Negative for activity change, appetite change, chills, fatigue, fever and unexpected weight change.  HENT: Negative for hearing loss.   Eyes: Negative  for visual disturbance.  Respiratory: Negative for cough, chest tightness, shortness of breath and wheezing.   Cardiovascular: Negative for chest pain, palpitations and leg swelling.  Gastrointestinal: Negative for abdominal distention, abdominal pain, blood in stool, constipation, diarrhea, nausea and vomiting.  Genitourinary: Negative for difficulty urinating and hematuria.  Musculoskeletal: Negative for arthralgias, myalgias and neck pain.  Skin: Negative for rash.  Neurological: Negative for  dizziness, seizures, syncope and headaches.  Hematological: Negative for adenopathy. Does not bruise/bleed easily.  Psychiatric/Behavioral: Negative for dysphoric mood. The patient is not nervous/anxious.    Objective:  BP 118/68 (BP Location: Left Arm, Patient Position: Sitting, Cuff Size: Normal)   Pulse 75   Temp 97.6 F (36.4 C) (Temporal)   Ht 5\' 9"  (1.753 m)   Wt 162 lb 4 oz (73.6 kg)   SpO2 97%   BMI 23.96 kg/m   Wt Readings from Last 3 Encounters:  01/08/21 162 lb 4 oz (73.6 kg)  05/10/20 153 lb 2 oz (69.5 kg)  02/17/20 157 lb 3 oz (71.3 kg)      Physical Exam Vitals and nursing note reviewed.  Constitutional:      General: He is not in acute distress.    Appearance: Normal appearance. He is well-developed and well-nourished. He is not ill-appearing.  HENT:     Head: Normocephalic and atraumatic.     Right Ear: Hearing, tympanic membrane, ear canal and external ear normal.     Left Ear: Hearing, tympanic membrane, ear canal and external ear normal.     Mouth/Throat:     Mouth: Oropharynx is clear and moist and mucous membranes are normal.     Pharynx: No posterior oropharyngeal edema.  Eyes:     General: No scleral icterus.    Extraocular Movements: Extraocular movements intact and EOM normal.     Conjunctiva/sclera: Conjunctivae normal.     Pupils: Pupils are equal, round, and reactive to light.  Neck:     Thyroid: No thyroid mass or thyromegaly.     Vascular: No carotid bruit.  Cardiovascular:     Rate and Rhythm: Normal rate and regular rhythm.     Pulses: Normal pulses and intact distal pulses.          Radial pulses are 2+ on the right side and 2+ on the left side.     Heart sounds: Normal heart sounds. No murmur heard.   Pulmonary:     Effort: Pulmonary effort is normal. No respiratory distress.     Breath sounds: Normal breath sounds. No wheezing, rhonchi or rales.  Abdominal:     General: Abdomen is flat. Bowel sounds are normal. There is no  distension.     Palpations: Abdomen is soft. There is no mass.     Tenderness: There is no abdominal tenderness. There is no guarding or rebound.     Hernia: No hernia is present.  Musculoskeletal:        General: No edema. Normal range of motion.     Cervical back: Normal range of motion and neck supple.     Right lower leg: No edema.     Left lower leg: No edema.  Lymphadenopathy:     Cervical: No cervical adenopathy.  Skin:    General: Skin is warm and dry.     Findings: No rash.  Neurological:     General: No focal deficit present.     Mental Status: He is alert and oriented to person, place, and time.  Comments: CN grossly intact, station and gait intact  Psychiatric:        Mood and Affect: Mood and affect and mood normal.        Behavior: Behavior normal.        Thought Content: Thought content normal.        Judgment: Judgment normal.       Results for orders placed or performed in visit on 01/01/21  TSH  Result Value Ref Range   TSH 1.84 0.35 - 4.50 uIU/mL  PSA  Result Value Ref Range   PSA 0.79 0.10 - 4.00 ng/mL  Comprehensive metabolic panel  Result Value Ref Range   Sodium 140 135 - 145 mEq/L   Potassium 4.0 3.5 - 5.1 mEq/L   Chloride 105 96 - 112 mEq/L   CO2 30 19 - 32 mEq/L   Glucose, Bld 94 70 - 99 mg/dL   BUN 16 6 - 23 mg/dL   Creatinine, Ser 3.81 0.40 - 1.50 mg/dL   Total Bilirubin 0.6 0.2 - 1.2 mg/dL   Alkaline Phosphatase 53 39 - 117 U/L   AST 12 0 - 37 U/L   ALT 10 0 - 53 U/L   Total Protein 6.9 6.0 - 8.3 g/dL   Albumin 4.4 3.5 - 5.2 g/dL   GFR 77.11 >65.79 mL/min   Calcium 9.5 8.4 - 10.5 mg/dL  Lipid panel  Result Value Ref Range   Cholesterol 195 0 - 200 mg/dL   Triglycerides 038.3 0.0 - 149.0 mg/dL   HDL 33.83 >29.19 mg/dL   VLDL 16.6 0.0 - 06.0 mg/dL   LDL Cholesterol 045 (H) 0 - 99 mg/dL   Total CHOL/HDL Ratio 4    NonHDL 148.96    Assessment & Plan:  This visit occurred during the SARS-CoV-2 public health emergency.  Safety  protocols were in place, including screening questions prior to the visit, additional usage of staff PPE, and extensive cleaning of exam room while observing appropriate contact time as indicated for disinfecting solutions.   Problem List Items Addressed This Visit    TIA (transient ischemic attack)    Reviewed last year's hospitalization.  He will start statin. Ok to hold aspirin at this time with low threshold to start if any neurological symptoms recur.       Relevant Medications   atorvastatin (LIPITOR) 40 MG tablet   Smoker    Encouraged fully quitting E cigs Will refer for lung cancer screen      Relevant Orders   Ambulatory Referral for Lung Cancer Scre   Healthcare maintenance - Primary    Preventative protocols reviewed and updated unless pt declined. Discussed healthy diet and lifestyle.       Dyslipidemia    Chronic, mild. Will restart atorvastatin given ?h/o TIA 02/2020.  The ASCVD Risk score Denman George DC Jr., et al., 2013) failed to calculate for the following reasons:   The patient has a prior MI or stroke diagnosis       Relevant Medications   atorvastatin (LIPITOR) 40 MG tablet       Meds ordered this encounter  Medications  . atorvastatin (LIPITOR) 40 MG tablet    Sig: Take 1 tablet (40 mg total) by mouth at bedtime.    Dispense:  90 tablet    Refill:  3   Orders Placed This Encounter  Procedures  . Ambulatory Referral for Lung Cancer Scre    Referral Priority:   Routine    Referral Type:   Consultation  Referral Reason:   Specialty Services Required    Number of Visits Requested:   1    Patient instructions: Ok to hold aspirin for now. Continue nightly statin.  We will refer you for lung cancer screening.  Consider shingrix vaccine - if interested call us to schedule nurse visit.  Let us know when you need refill of diflucan fore tinea versicolor.  Return as needed or in 1 year for next physical.  You are doing well today.   Follow up  plan: Return in about 1 year (around 01/08/2022) for annual exam, prior fasting for blood work.  [name], MD

## 2021-01-21 ENCOUNTER — Telehealth: Payer: Self-pay | Admitting: *Deleted

## 2021-01-21 DIAGNOSIS — Z122 Encounter for screening for malignant neoplasm of respiratory organs: Secondary | ICD-10-CM

## 2021-01-21 DIAGNOSIS — Z87891 Personal history of nicotine dependence: Secondary | ICD-10-CM

## 2021-01-21 NOTE — Telephone Encounter (Signed)
Received referral for initial lung cancer screening scan. Contacted patient and obtained smoking history,(former, quit 2018, 34 pack year) as well as answering questions related to screening process. Patient denies signs of lung cancer such as weight loss or hemoptysis. Patient denies comorbidity that would prevent curative treatment if lung cancer were found. Patient is scheduled for shared decision making visit and CT scan on 01/30/21 at 1030am.

## 2021-01-30 ENCOUNTER — Inpatient Hospital Stay: Payer: BC Managed Care – PPO | Attending: Oncology | Admitting: Oncology

## 2021-01-30 ENCOUNTER — Ambulatory Visit
Admission: RE | Admit: 2021-01-30 | Discharge: 2021-01-30 | Disposition: A | Discharge status: Home / Self Care | Payer: BC Managed Care – PPO | Source: Ambulatory Visit | Attending: Oncology | Admitting: Oncology

## 2021-01-30 ENCOUNTER — Encounter: Payer: Self-pay | Admitting: Oncology

## 2021-01-30 ENCOUNTER — Other Ambulatory Visit: Payer: Self-pay

## 2021-01-30 DIAGNOSIS — Z122 Encounter for screening for malignant neoplasm of respiratory organs: Secondary | ICD-10-CM | POA: Insufficient documentation

## 2021-01-30 DIAGNOSIS — Z87891 Personal history of nicotine dependence: Secondary | ICD-10-CM | POA: Diagnosis not present

## 2021-01-30 NOTE — Progress Notes (Signed)
Virtual Visit via Video Note  I connected with Derrick Young on 01/30/21 at 10:30 AM EST by a video enabled telemedicine application and verified that I am speaking with the correct person using two identifiers.  Location: Patient: [facility] Provider: Clinic    I discussed the limitations of evaluation and management by telemedicine and the availability of in person appointments. The patient expressed understanding and agreed to proceed.  I discussed the assessment and treatment plan with the patient. The patient was provided an opportunity to ask questions and all were answered. The patient agreed with the plan and demonstrated an understanding of the instructions.   The patient was advised to call back or seek an in-person evaluation if the symptoms worsen or if the condition fails to improve as anticipated.   In accordance with CMS guidelines, patient has met eligibility criteria including age, absence of signs or symptoms of lung cancer.  Social History   Tobacco Use  . Smoking status: Former Smoker    Packs/day: 1.00    Years: 34.00    Pack years: 34.00    Types: E-cigarettes, Cigarettes    Quit date: 2018    Years since quitting: 4.1  . Smokeless tobacco: Never Used  Substance Use Topics  . Alcohol use: No  . Drug use: No      A shared decision-making session was conducted prior to the performance of CT scan. This includes one or more decision aids, includes benefits and harms of screening, follow-up diagnostic testing, over-diagnosis, false positive rate, and total radiation exposure.   Counseling on the importance of adherence to annual lung cancer LDCT screening, impact of co-morbidities, and ability or willingness to undergo diagnosis and treatment is imperative for compliance of the program.   Counseling on the importance of continued smoking cessation for former smokers; the importance of smoking cessation for current smokers, and information about tobacco cessation  interventions have been given to patient including [place] and [facility] quit [place] programs.   Written order for lung cancer screening with LDCT has been given to the patient and any and all questions have been answered to the best of my abilities.    Yearly follow up will be coordinated by Burgess Estelle, Thoracic Navigator.  I provided 15 minutes of face-to-face video visit time during this encounter, and > 50% was spent counseling as documented under my assessment & plan.   Jacquelin Hawking, NP

## 2021-02-04 ENCOUNTER — Telehealth: Payer: Self-pay | Admitting: *Deleted

## 2021-02-04 DIAGNOSIS — Z122 Encounter for screening for malignant neoplasm of respiratory organs: Secondary | ICD-10-CM

## 2021-02-04 DIAGNOSIS — Z87891 Personal history of nicotine dependence: Secondary | ICD-10-CM

## 2021-02-04 DIAGNOSIS — J439 Emphysema, unspecified: Secondary | ICD-10-CM

## 2021-02-04 DIAGNOSIS — I700 Atherosclerosis of aorta: Secondary | ICD-10-CM

## 2021-02-04 NOTE — Telephone Encounter (Signed)
Patient called stating that he had a CT scan done last week and would like for someone to call and explain the results to him. Patient stated that Dr. Sharen Hones ordered the CT scan.

## 2021-02-04 NOTE — Telephone Encounter (Signed)
Notified patient of LDCT lung cancer screening program results with recommendation for 12 month follow up imaging. Also notified of incidental findings noted below and is encouraged to discuss further with PCP who will receive a copy of this note and/or the CT report. Patient verbalizes understanding. And appt. Is made for following year per patient request.   IMPRESSION: 1. Lung-RADS 2, benign appearance or behavior. Continue annual screening with low-dose chest CT without contrast in 12 months. 2. Aortic Atherosclerosis (ICD10-I70.0) and Emphysema (ICD10-J43.9).

## 2021-03-28 DIAGNOSIS — J439 Emphysema, unspecified: Secondary | ICD-10-CM | POA: Insufficient documentation

## 2021-03-28 DIAGNOSIS — I700 Atherosclerosis of aorta: Secondary | ICD-10-CM | POA: Insufficient documentation

## 2021-03-28 NOTE — Telephone Encounter (Signed)
Appears pt was contacted about the results of his CT lung by the lung cancer screening group on 2/28.   Routing to MA to call patient and verify that he has received the report.   Will add aortic atherosclerosis and emphysema to the problem list.   He can discuss this in more detail at his next visit with Dr. Reece Agar. No need for earlier follow-up

## 2021-06-18 IMAGING — CT CT CHEST LUNG CANCER SCREENING LOW DOSE W/O CM
2 of 5 series · 15 of 40 positions shown, 18 images · non-contrast
Comparison: None.

CLINICAL DATA: Lung cancer screening. Asymptomatic. Former smoker.
Thirty-four pack-year history.

EXAM:
CT CHEST WITHOUT CONTRAST LOW-DOSE FOR LUNG CANCER SCREENING
TECHNIQUE: Multidetector CT imaging of the chest was performed following the
standard protocol without IV contrast.

[Series 3: lung 1.00 · axial · 0.66mm/px · z∈[-1224,-897]mm · 12 of 363 slices shown, 15 images]
[im 18/363  mediastinal]
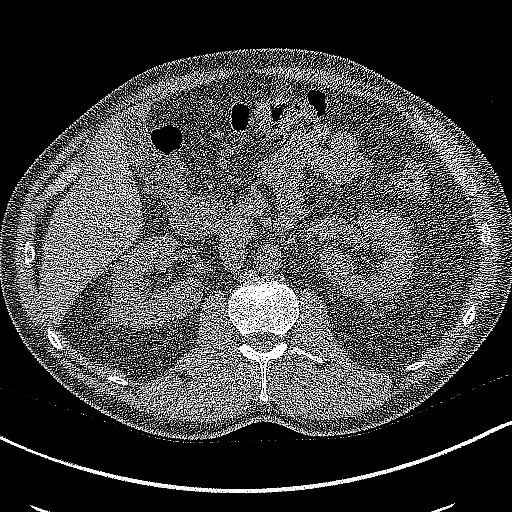
[im 18/363  lung]
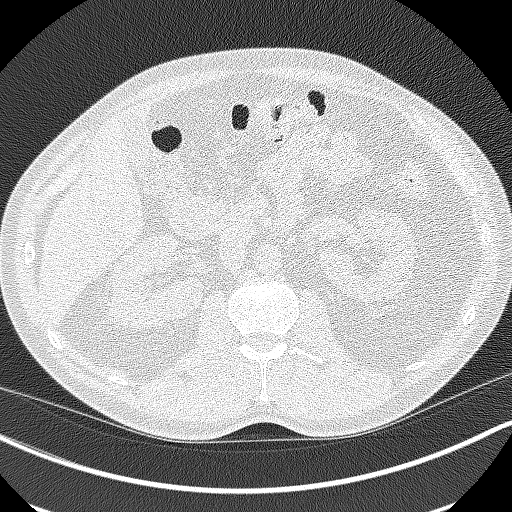
[im 52/363  lung]
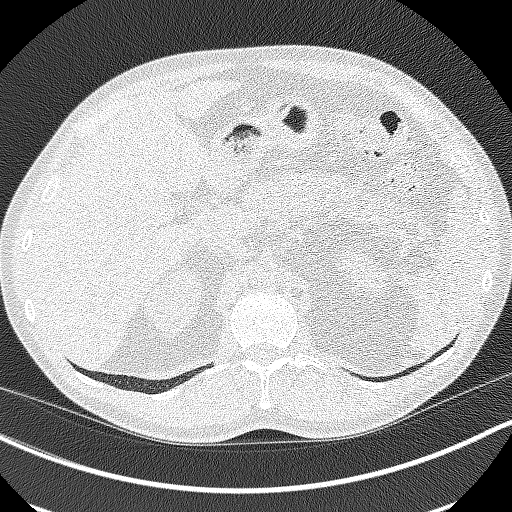
[im 87/363  lung]
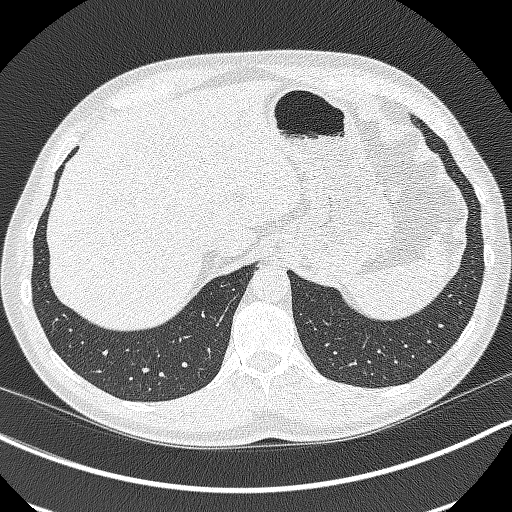
[im 104/363  lung]
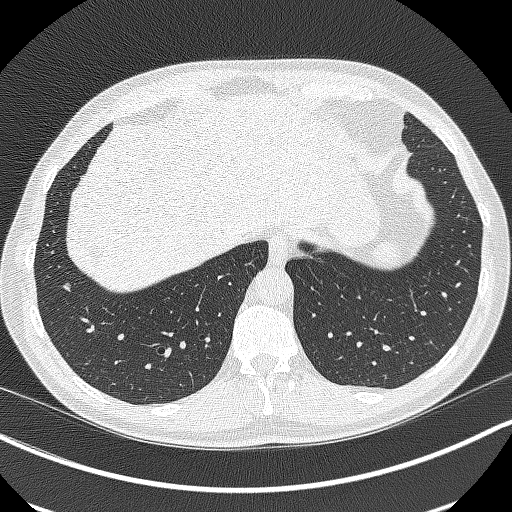
[im 138/363  mediastinal]
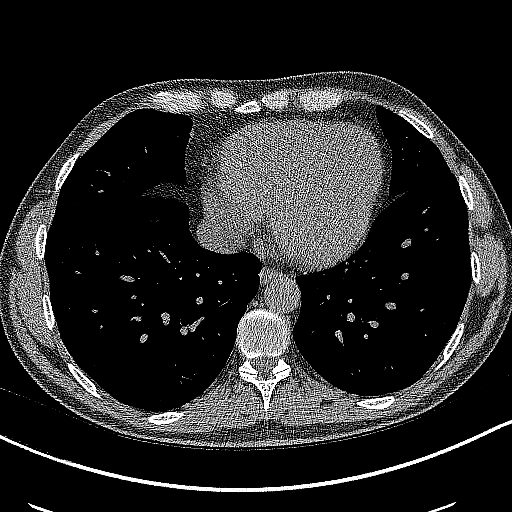
[im 138/363  lung]
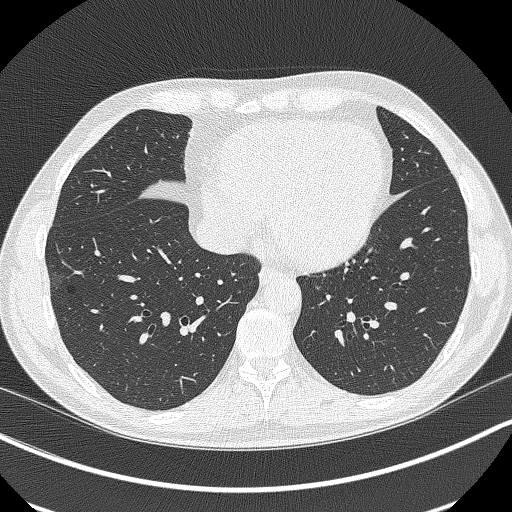
[im 173/363  lung]
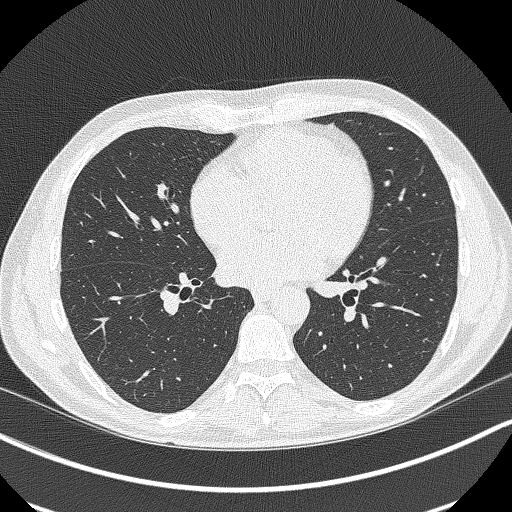
[im 190/363  lung]
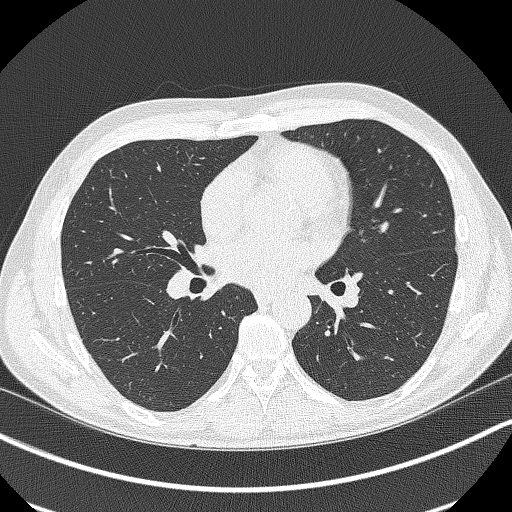
[im 225/363  lung]
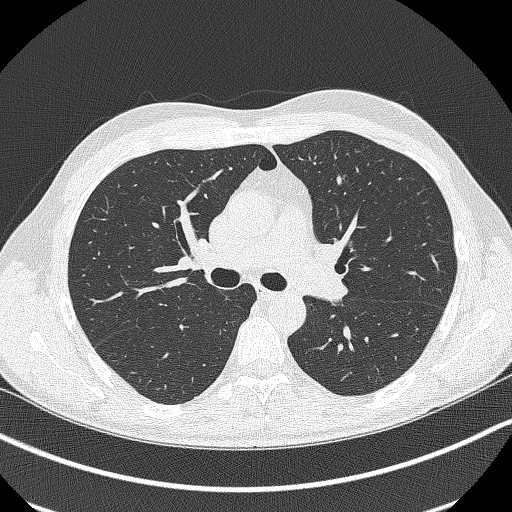
[im 259/363  mediastinal]
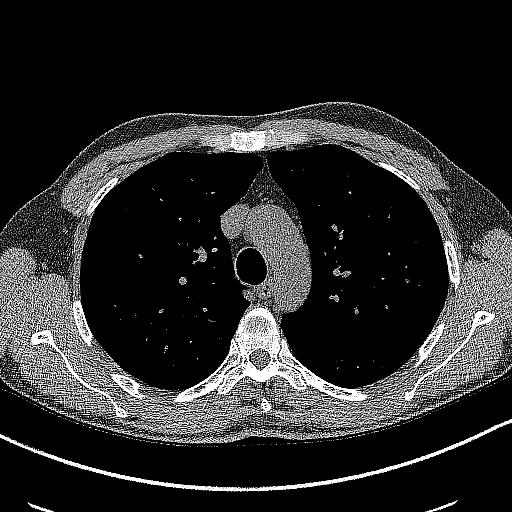
[im 259/363  lung]
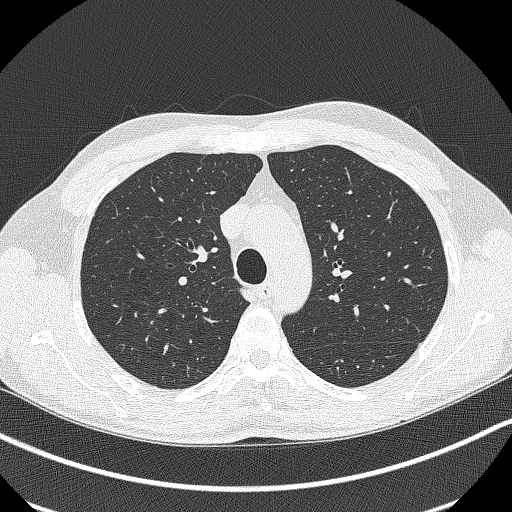
[im 276/363  lung]
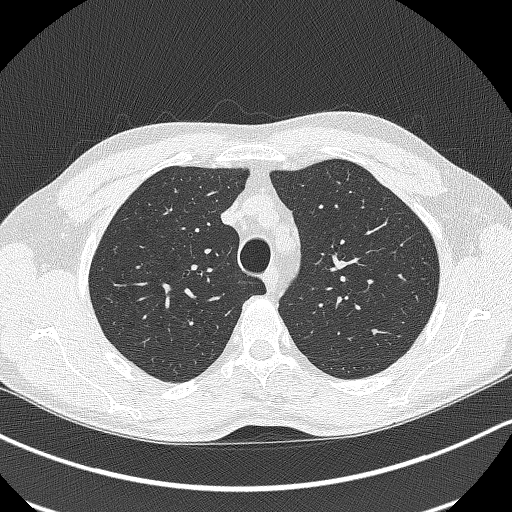
[im 311/363  lung]
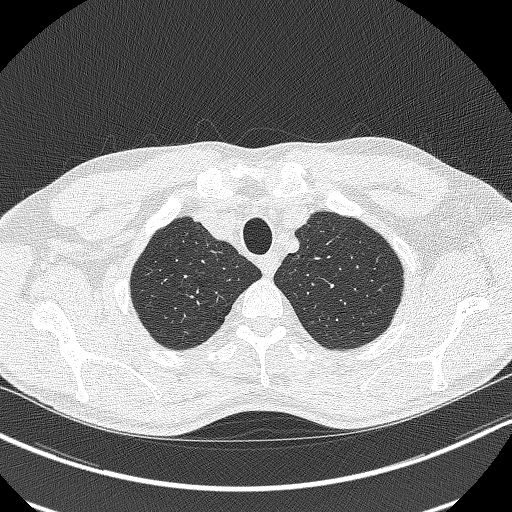
[im 345/363  lung]
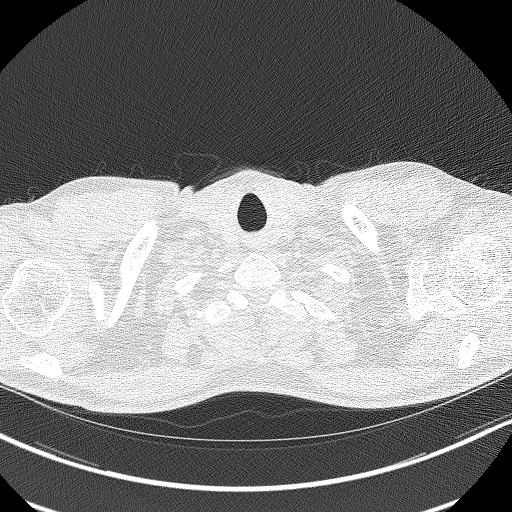

[Series 4: coronals lung 1.00 cor · coronal · 0.66mm/px · 3 of 267 slices shown]
[im 54/267  lung]
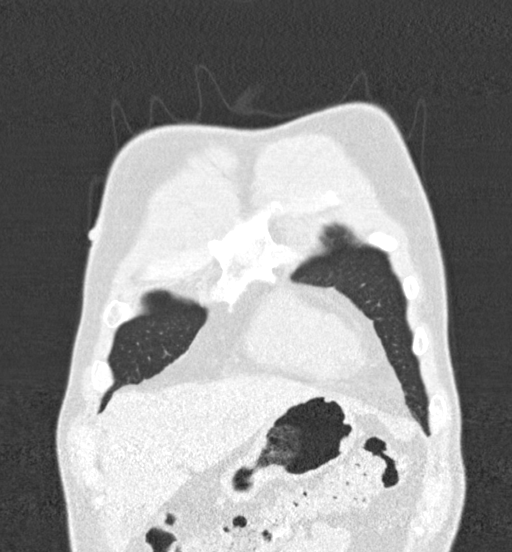
[im 107/267  lung]
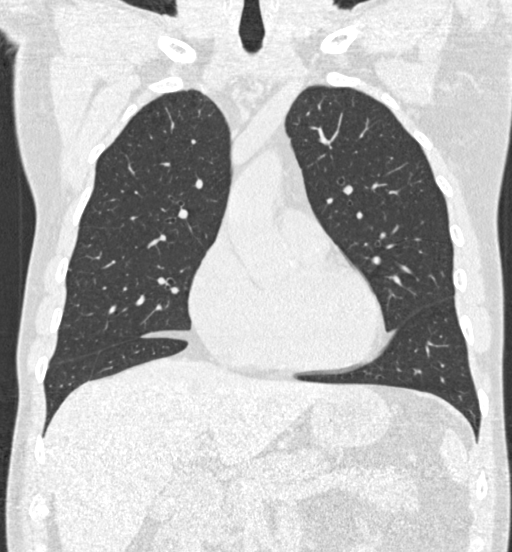
[im 160/267  lung]
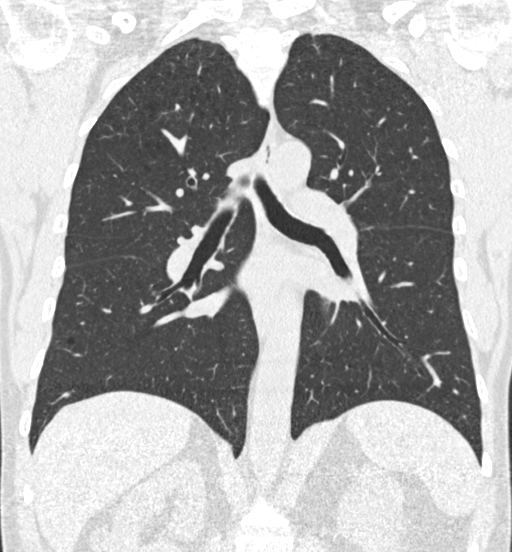

[15 of 40 positions shown; findings below may reference images not displayed]

FINDINGS: Cardiovascular: Heart size appears within normal limits. There is no
pericardial effusion.

Mediastinum/Nodes: No enlarged mediastinal, hilar, or axillary lymph
nodes. Thyroid gland, trachea, and esophagus demonstrate no
significant findings.

Lungs/Pleura: Centrilobular and paraseptal emphysema. Mild diffuse
bronchial wall thickening. No airspace consolidation, atelectasis or
pneumothorax identified. Single tiny lung nodule is identified
within the lateral aspect of the right middle lobe. This has an
equivalent diameter of 1.3 mm. No suspicious lung nodules.

Upper Abdomen: No acute findings within the imaged portions of the
upper abdomen. Multiple low-attenuation foci within the liver are
noted as seen on CT AP dated 11/09/2011. These are favored to
represent multiple benign liver cysts.

Musculoskeletal: No chest wall mass or suspicious bone lesions
identified.
IMPRESSION: 1. Lung-RADS 2, benign appearance or behavior. Continue annual
screening with low-dose chest CT without contrast in 12 months.
2. Aortic Atherosclerosis ([id number]-[id number].N) and Emphysema ([id number].W).

## 2021-07-31 DIAGNOSIS — Z7252 High risk homosexual behavior: Secondary | ICD-10-CM | POA: Diagnosis not present

## 2021-07-31 DIAGNOSIS — R21 Rash and other nonspecific skin eruption: Secondary | ICD-10-CM | POA: Diagnosis not present

## 2021-07-31 DIAGNOSIS — A530 Latent syphilis, unspecified as early or late: Secondary | ICD-10-CM | POA: Diagnosis not present

## 2021-08-01 DIAGNOSIS — Z7252 High risk homosexual behavior: Secondary | ICD-10-CM | POA: Diagnosis not present

## 2021-08-05 DIAGNOSIS — Z7252 High risk homosexual behavior: Secondary | ICD-10-CM | POA: Diagnosis not present

## 2021-08-09 ENCOUNTER — Ambulatory Visit: Payer: BC Managed Care – PPO | Admitting: Family Medicine

## 2021-08-09 ENCOUNTER — Other Ambulatory Visit: Payer: Self-pay

## 2021-08-09 ENCOUNTER — Encounter: Payer: Self-pay | Admitting: Family Medicine

## 2021-08-09 VITALS — BP 120/74 | HR 75 | Temp 97.9°F | Ht 69.0 in | Wt 146.0 lb

## 2021-08-09 DIAGNOSIS — A539 Syphilis, unspecified: Secondary | ICD-10-CM

## 2021-08-09 DIAGNOSIS — Z8619 Personal history of other infectious and parasitic diseases: Secondary | ICD-10-CM | POA: Insufficient documentation

## 2021-08-09 DIAGNOSIS — A5149 Other secondary syphilitic conditions: Secondary | ICD-10-CM | POA: Diagnosis not present

## 2021-08-09 HISTORY — DX: Personal history of other infectious and parasitic diseases: Z86.19

## 2021-08-09 MED ORDER — PENICILLIN G BENZATHINE 1200000 UNIT/2ML IM SUSY
1.2000 10*6.[IU] | PREFILLED_SYRINGE | Freq: Once | INTRAMUSCULAR | Status: AC
  Administered 2021-08-09: 1.2 10*6.[IU] via INTRAMUSCULAR

## 2021-08-09 NOTE — Progress Notes (Signed)
Patient ID: Derrick Young, male    DOB: [birth year], 55 y.o.   MRN: [id number]  This visit was conducted in person.  BP 120/74   Pulse 75   Temp 97.9 F (36.6 C) (Temporal)   Ht 5\' 9"  (1.753 m)   Wt 146 lb (66.2 kg)   SpO2 98%   BMI 21.56 kg/m    CC: positive syphilis test  Subjective:   HPI: Derrick Young is a 55 y.o. male presenting on 08/09/2021 for Exposure to STD (Pt tested positive for syphilis 1st on 08/01/21, then on 08/05/21.  Requesting tx. )   Seen at [facility] last week with new rash to abdomen in setting of new sexual partner. Rash started several weeks ago - denies fevers/chills, abd pain, ST, malaise or fatigue, hair loss, RUQ abd pain. 4 wks ago noted bump to mouth that then resolved.  No symptoms of late syphilis or neurosyphilis.  Tested negative for chlamydia and gonorrhea and HIV but positive RPR with titers of 1:16 (08/01/2021) and positive FTA antibodies (08/05/2021).   New male sexual partner in the last 3-6 months.  No prior history of syphilis. Last negative RPR 07/05/2018.  [facility] did schedule appt with ID for next week.   Patient has spoken with wife about this - she is receiving testing/treatment today through her OBGYN.      Relevant past medical, surgical, family and social history reviewed and updated as indicated. Interim medical history since our last visit reviewed. Allergies and medications reviewed and updated. Outpatient Medications Prior to Visit  Medication Sig Dispense Refill   atorvastatin (LIPITOR) 40 MG tablet Take 1 tablet (40 mg total) by mouth at bedtime. 90 tablet 3   cholecalciferol (VITAMIN D3) 25 MCG (1000 UNIT) tablet Take 1,000 Units by mouth daily.     fluconazole (DIFLUCAN) 150 MG tablet Take 2 tablets (300 mg total) by mouth once a week. For 2 weeks 4 tablet 0   hydrocortisone (ANUSOL-HC) 2.5 % rectal cream Place 1 application rectally 2 (two) times daily. 30 g 0   ibuprofen (ADVIL,MOTRIN) 200 MG tablet Take 200 mg by mouth  every 6 (six) hours as needed.     Multiple Vitamins-Minerals (AIRBORNE PO) Take 1 tablet by mouth daily.     nitroGLYCERIN (NITROSTAT) 0.4 MG SL tablet Place 1 tablet (0.4 mg total) under the tongue every 5 (five) minutes x 3 doses as needed for chest pain. 30 tablet 1   zinc gluconate 50 MG tablet Take 50 mg by mouth daily.     triamcinolone cream (KENALOG) 0.1 % Apply topically 2 (two) times daily. (Patient not taking: Reported on 08/09/2021)     predniSONE (DELTASONE) 20 MG tablet Take by mouth.     No facility-administered medications prior to visit.     Per HPI unless specifically indicated in ROS section below Review of Systems  Objective:  BP 120/74   Pulse 75   Temp 97.9 F (36.6 C) (Temporal)   Ht 5\' 9"  (1.753 m)   Wt 146 lb (66.2 kg)   SpO2 98%   BMI 21.56 kg/m   Wt Readings from Last 3 Encounters:  08/09/21 146 lb (66.2 kg)  01/30/21 155 lb (70.3 kg)  01/08/21 162 lb 4 oz (73.6 kg)      Physical Exam Vitals and nursing note reviewed.  Constitutional:      Appearance: Normal appearance. He is not ill-appearing.  HENT:     Mouth/Throat:     Mouth: Mucous  membranes are moist.     Pharynx: No oropharyngeal exudate or posterior oropharyngeal erythema.     Comments: Mild mucosal maceration to left inner lip Eyes:     Extraocular Movements: Extraocular movements intact.     Pupils: Pupils are equal, round, and reactive to light.  Cardiovascular:     Rate and Rhythm: Normal rate and regular rhythm.     Pulses: Normal pulses.     Heart sounds: Normal heart sounds. No murmur heard. Pulmonary:     Effort: Pulmonary effort is normal. No respiratory distress.     Breath sounds: Normal breath sounds. No wheezing, rhonchi or rales.  Abdominal:     General: Bowel sounds are normal. There is no distension.     Palpations: Abdomen is soft. There is no mass.     Tenderness: There is no abdominal tenderness. There is no guarding or rebound.     Hernia: No hernia is present.   Musculoskeletal:     Right lower leg: No edema.     Left lower leg: No edema.  Skin:    General: Skin is warm and dry.     Findings: Rash present. No erythema.     Comments: Diffuse maculopapular rash throughout trunk, back, abdomen, upper extremities. No rash on palms or soles.   Neurological:     Mental Status: He is alert.  Psychiatric:        Mood and Affect: Mood normal.        Behavior: Behavior normal.      Results for orders placed or performed in visit on 01/01/21  TSH  Result Value Ref Range   TSH 1.84 0.35 - 4.50 uIU/mL  PSA  Result Value Ref Range   PSA 0.79 0.10 - 4.00 ng/mL  Comprehensive metabolic panel  Result Value Ref Range   Sodium 140 135 - 145 mEq/L   Potassium 4.0 3.5 - 5.1 mEq/L   Chloride 105 96 - 112 mEq/L   CO2 30 19 - 32 mEq/L   Glucose, Bld 94 70 - 99 mg/dL   BUN 16 6 - 23 mg/dL   Creatinine, Ser 0.98 0.40 - 1.50 mg/dL   Total Bilirubin 0.6 0.2 - 1.2 mg/dL   Alkaline Phosphatase 53 39 - 117 U/L   AST 12 0 - 37 U/L   ALT 10 0 - 53 U/L   Total Protein 6.9 6.0 - 8.3 g/dL   Albumin 4.4 3.5 - 5.2 g/dL   GFR 11.91 >47.82 mL/min   Calcium 9.5 8.4 - 10.5 mg/dL  Lipid panel  Result Value Ref Range   Cholesterol 195 0 - 200 mg/dL   Triglycerides 956.2 0.0 - 149.0 mg/dL   HDL 13.08 >65.78 mg/dL   VLDL 46.9 0.0 - 62.9 mg/dL   LDL Cholesterol 528 (H) 0 - 99 mg/dL   Total CHOL/HDL Ratio 4    NonHDL 148.96     Assessment & Plan:  This visit occurred during the SARS-CoV-2 public health emergency.  Safety protocols were in place, including screening questions prior to the visit, additional usage of staff PPE, and extensive cleaning of exam room while observing appropriate contact time as indicated for disinfecting solutions.   Problem List Items Addressed This Visit     Secondary syphilis in male - Primary    Given rash and time frame, anticipate secondary syphilis. Treat with PCN 2.4 M units IM today. Pt aware we will notify HD of today's  treatment.  Reviewed symptoms of possible Jarisch-Herxheimer reaction.  Patient will return in 6 months for clinical evaluation and rpt serology, and again in 12 months.         Meds ordered this encounter  Medications   penicillin g benzathine (BICILLIN LA) 1200000 UNIT/2ML injection 1.2 Million Units    No orders of the defined types were placed in this encounter.   Patient Instructions  Treat with penicillin injection today  Schedule 6 month follow up visit/physical for re exam and labs.   Follow up plan: Return in about 6 months (around 02/06/2022) for annual exam, prior fasting for blood work.  [name], MD

## 2021-08-09 NOTE — Assessment & Plan Note (Signed)
Given rash and time frame, anticipate secondary syphilis. Treat with PCN 2.4 M units IM today. Pt aware we will notify HD of today's treatment.  Reviewed symptoms of possible Jarisch-Herxheimer reaction.  Patient will return in 6 months for clinical evaluation and rpt serology, and again in 12 months.

## 2021-08-09 NOTE — Patient Instructions (Addendum)
Treat with penicillin injection today  Schedule 6 month follow up visit/physical for re exam and labs.

## 2021-08-15 DIAGNOSIS — A5149 Other secondary syphilitic conditions: Secondary | ICD-10-CM | POA: Diagnosis not present

## 2021-08-19 ENCOUNTER — Telehealth: Payer: Self-pay

## 2021-08-19 NOTE — Telephone Encounter (Signed)
Noted  

## 2021-08-19 NOTE — Telephone Encounter (Signed)
Beckey Downing with [facility] calling and wants to confirm that pt got penicillin g benzathine 1,200,000 units per 2 ml x 2 to equal 2,400,000 units given. Per Administration section of pts chart on 08/09/21 visit it is documented that pt received penicillin g benzathine 1,200,00 units given in bilateral ventrogluteal. And there was also a charge for 2 injections. Erie Noe said that was verification enough. Sending note to [facility] for verification of above info. If correct does not need to contact the [facility].

## 2021-10-23 DIAGNOSIS — A5149 Other secondary syphilitic conditions: Secondary | ICD-10-CM | POA: Diagnosis not present

## 2021-12-31 ENCOUNTER — Telehealth: Payer: Self-pay | Admitting: Acute Care

## 2021-12-31 NOTE — Telephone Encounter (Signed)
Returned call to patient, after receiving VM to [facility], but unable to reach. VM message stated patient would like to cancel 01/30/22 appointment.  Appointments show patient has LDCT scheduled by another provider on that date. Unable to leave voicemail, as after several rings, no one answered and phone call ended abruptly.  Verified there is a new referral for this patient to the [facility] program and if patient cancels CT, he has completed a sdmv with [place] and a new order can be placed for the LDCT if he cancels and wishes to reschedule.

## 2022-01-06 ENCOUNTER — Other Ambulatory Visit: Payer: Self-pay

## 2022-01-06 DIAGNOSIS — Z87891 Personal history of nicotine dependence: Secondary | ICD-10-CM

## 2022-01-30 ENCOUNTER — Ambulatory Visit: Payer: BC Managed Care – PPO

## 2022-01-31 DIAGNOSIS — A539 Syphilis, unspecified: Secondary | ICD-10-CM | POA: Diagnosis not present

## 2022-02-04 ENCOUNTER — Ambulatory Visit
Admission: RE | Admit: 2022-02-04 | Discharge: 2022-02-04 | Disposition: A | Discharge status: Home / Self Care | Payer: BC Managed Care – PPO | Source: Ambulatory Visit | Attending: Acute Care | Admitting: Acute Care

## 2022-02-04 ENCOUNTER — Other Ambulatory Visit: Payer: Self-pay

## 2022-02-04 DIAGNOSIS — Z87891 Personal history of nicotine dependence: Secondary | ICD-10-CM | POA: Diagnosis not present

## 2022-02-05 ENCOUNTER — Other Ambulatory Visit: Payer: Self-pay

## 2022-02-05 DIAGNOSIS — Z87891 Personal history of nicotine dependence: Secondary | ICD-10-CM

## 2022-02-10 ENCOUNTER — Other Ambulatory Visit: Payer: Self-pay | Admitting: Family Medicine

## 2022-02-10 DIAGNOSIS — Z125 Encounter for screening for malignant neoplasm of prostate: Secondary | ICD-10-CM

## 2022-02-10 DIAGNOSIS — A5149 Other secondary syphilitic conditions: Secondary | ICD-10-CM

## 2022-02-10 DIAGNOSIS — E785 Hyperlipidemia, unspecified: Secondary | ICD-10-CM

## 2022-02-11 ENCOUNTER — Other Ambulatory Visit (INDEPENDENT_AMBULATORY_CARE_PROVIDER_SITE_OTHER): Payer: BC Managed Care – PPO

## 2022-02-11 ENCOUNTER — Other Ambulatory Visit: Payer: Self-pay

## 2022-02-11 DIAGNOSIS — Z125 Encounter for screening for malignant neoplasm of prostate: Secondary | ICD-10-CM | POA: Diagnosis not present

## 2022-02-11 DIAGNOSIS — A5149 Other secondary syphilitic conditions: Secondary | ICD-10-CM | POA: Diagnosis not present

## 2022-02-11 DIAGNOSIS — E785 Hyperlipidemia, unspecified: Secondary | ICD-10-CM | POA: Diagnosis not present

## 2022-02-11 DIAGNOSIS — Z113 Encounter for screening for infections with a predominantly sexual mode of transmission: Secondary | ICD-10-CM | POA: Diagnosis not present

## 2022-02-11 LAB — COMPREHENSIVE METABOLIC PANEL
ALT: 24 U/L (ref 0–53)
AST: 21 U/L (ref 0–37)
Albumin: 4.2 g/dL (ref 3.5–5.2)
Alkaline Phosphatase: 58 U/L (ref 39–117)
BUN: 15 mg/dL (ref 6–23)
CO2: 26 mEq/L (ref 19–32)
Calcium: 9.5 mg/dL (ref 8.4–10.5)
Chloride: 107 mEq/L (ref 96–112)
Creatinine, Ser: 0.88 mg/dL (ref 0.40–1.50)
GFR: 97.49 mL/min (ref >60.00)
Glucose, Bld: 94 mg/dL (ref 70–99)
Potassium: 3.9 mEq/L (ref 3.5–5.1)
Sodium: 141 mEq/L (ref 135–145)
Total Bilirubin: 0.6 mg/dL (ref 0.2–1.2)
Total Protein: 6.5 g/dL (ref 6.0–8.3)

## 2022-02-11 LAB — PSA: PSA: 0.72 ng/mL (ref 0.10–4.00)

## 2022-02-11 LAB — CBC WITH DIFFERENTIAL/PLATELET
Basophils Absolute: 0 10*3/uL (ref 0.0–0.1)
Basophils Relative: 0.7 % (ref 0.0–3.0)
Eosinophils Absolute: 0.1 10*3/uL (ref 0.0–0.7)
Eosinophils Relative: 1.6 % (ref 0.0–5.0)
HCT: 39.3 % (ref 39.0–52.0)
Hemoglobin: 13.5 g/dL (ref 13.0–17.0)
Lymphocytes Relative: 44.6 % (ref 12.0–46.0)
Lymphs Abs: 2.9 10*3/uL (ref 0.7–4.0)
MCHC: 34.5 g/dL (ref 30.0–36.0)
MCV: 89 fl (ref 78.0–100.0)
Monocytes Absolute: 0.6 10*3/uL (ref 0.1–1.0)
Monocytes Relative: 8.9 % (ref 3.0–12.0)
Neutro Abs: 2.8 10*3/uL (ref 1.4–7.7)
Neutrophils Relative %: 44.2 % (ref 43.0–77.0)
Platelets: 219 10*3/uL (ref 150.0–400.0)
RBC: 4.41 Mil/uL (ref 4.22–5.81)
RDW: 12.7 % (ref 11.5–15.5)
WBC: 6.4 10*3/uL (ref 4.0–10.5)

## 2022-02-11 LAB — LIPID PANEL
Cholesterol: 132 mg/dL (ref 0–200)
HDL: 53.3 mg/dL (ref >39.00)
LDL Cholesterol: 61 mg/dL (ref 0–99)
NonHDL: 78.45
Total CHOL/HDL Ratio: 2
Triglycerides: 89 mg/dL (ref 0.0–149.0)
VLDL: 17.8 mg/dL (ref 0.0–40.0)

## 2022-02-12 LAB — RPR: RPR Ser Ql: NONREACTIVE

## 2022-02-14 ENCOUNTER — Encounter: Payer: Self-pay | Admitting: Family Medicine

## 2022-02-14 ENCOUNTER — Ambulatory Visit (INDEPENDENT_AMBULATORY_CARE_PROVIDER_SITE_OTHER): Payer: BC Managed Care – PPO | Admitting: Family Medicine

## 2022-02-14 ENCOUNTER — Other Ambulatory Visit: Payer: Self-pay

## 2022-02-14 VITALS — BP 122/72 | HR 72 | Temp 97.9°F | Ht 69.0 in | Wt 167.0 lb

## 2022-02-14 DIAGNOSIS — Z0000 Encounter for general adult medical examination without abnormal findings: Secondary | ICD-10-CM | POA: Diagnosis not present

## 2022-02-14 DIAGNOSIS — Z23 Encounter for immunization: Secondary | ICD-10-CM

## 2022-02-14 DIAGNOSIS — A5149 Other secondary syphilitic conditions: Secondary | ICD-10-CM

## 2022-02-14 DIAGNOSIS — F17200 Nicotine dependence, unspecified, uncomplicated: Secondary | ICD-10-CM

## 2022-02-14 DIAGNOSIS — N289 Disorder of kidney and ureter, unspecified: Secondary | ICD-10-CM | POA: Diagnosis not present

## 2022-02-14 DIAGNOSIS — E785 Hyperlipidemia, unspecified: Secondary | ICD-10-CM | POA: Diagnosis not present

## 2022-02-14 DIAGNOSIS — G459 Transient cerebral ischemic attack, unspecified: Secondary | ICD-10-CM

## 2022-02-14 DIAGNOSIS — J432 Centrilobular emphysema: Secondary | ICD-10-CM

## 2022-02-14 LAB — POC URINALSYSI DIPSTICK (AUTOMATED)
Blood, UA: NEGATIVE
Glucose, UA: NEGATIVE
Ketones, UA: NEGATIVE
Leukocytes, UA: NEGATIVE
Nitrite, UA: NEGATIVE
Protein, UA: NEGATIVE
Spec Grav, UA: >=1.03 — AB (ref 1.010–1.025)
Urobilinogen, UA: 0.2 E.U./dL
pH, UA: 6 (ref 5.0–8.0)

## 2022-02-14 MED ORDER — VITAMIN C 500 MG PO CAPS: 1.0000 | ORAL_CAPSULE | Freq: Every day | ORAL | Status: DC

## 2022-02-14 MED ORDER — ATORVASTATIN CALCIUM 40 MG PO TABS: 40.0000 mg | ORAL_TABLET | Freq: Every evening | ORAL | 3 refills | Status: DC

## 2022-02-14 NOTE — Assessment & Plan Note (Signed)
Preventative protocols reviewed and updated unless pt declined. Discussed healthy diet and lifestyle.  

## 2022-02-14 NOTE — Assessment & Plan Note (Addendum)
S/p treatment.  ?Initial titers 1:16, rpt RPR today normal.  ?Will repeat RPR in 6-12 months.  ?

## 2022-02-14 NOTE — Assessment & Plan Note (Signed)
Pt asxs.  

## 2022-02-14 NOTE — Assessment & Plan Note (Signed)
Chronic, mild, doing well on atorvastatin 40mg  nightly. ??TIA 2021 ?The ASCVD Risk score (Arnett DK, et al., 2019) failed to calculate for the following reasons: ?  The patient has a prior MI or stroke diagnosis  ?

## 2022-02-14 NOTE — Assessment & Plan Note (Signed)
Continue statin. He is not taking aspirin.  ?

## 2022-02-14 NOTE — Assessment & Plan Note (Signed)
Incidentally found on lung cancer screening CT, thought exophytic cyst. ?Check UA today.  ?

## 2022-02-14 NOTE — Patient Instructions (Addendum)
Start shingrix vaccine today. Return in 2-6 months for nurse visit to complete series.  ?Increase water intake, slowly taper off caffeinated beverages.  ?You are doing well today. ?Return as needed or in 1 year for next physical.  ? ?Health Maintenance, Male ?Adopting a healthy lifestyle and getting preventive care are important in promoting health and wellness. Ask your health care provider about: ?The right schedule for you to have regular tests and exams. ?Things you can do on your own to prevent diseases and keep yourself healthy. ?What should I know about diet, weight, and exercise? ?Eat a healthy diet ? ?Eat a diet that includes plenty of vegetables, fruits, low-fat dairy products, and lean protein. ?Do not eat a lot of foods that are high in solid fats, added sugars, or sodium. ?Maintain a healthy weight ?Body mass index (BMI) is a measurement that can be used to identify possible weight problems. It estimates body fat based on height and weight. Your health care provider can help determine your BMI and help you achieve or maintain a healthy weight. ?Get regular exercise ?Get regular exercise. This is one of the most important things you can do for your health. Most adults should: ?Exercise for at least 150 minutes each week. The exercise should increase your heart rate and make you sweat (moderate-intensity exercise). ?Do strengthening exercises at least twice a week. This is in addition to the moderate-intensity exercise. ?Spend less time sitting. Even light physical activity can be beneficial. ?Watch cholesterol and blood lipids ?Have your blood tested for lipids and cholesterol at 55 years of age, then have this test every 5 years. ?You may need to have your cholesterol levels checked more often if: ?Your lipid or cholesterol levels are high. ?You are older than 55 years of age. ?You are at high risk for heart disease. ?What should I know about cancer screening? ?Many types of cancers can be detected  early and may often be prevented. Depending on your health history and family history, you may need to have cancer screening at various ages. This may include screening for: ?Colorectal cancer. ?Prostate cancer. ?Skin cancer. ?Lung cancer. ?What should I know about heart disease, diabetes, and high blood pressure? ?Blood pressure and heart disease ?High blood pressure causes heart disease and increases the risk of stroke. This is more likely to develop in people who have high blood pressure readings or are overweight. ?Talk with your health care provider about your target blood pressure readings. ?Have your blood pressure checked: ?Every 3-5 years if you are 77-62 years of age. ?Every year if you are 5 years old or older. ?If you are between the ages of 51 and 79 and are a current or former smoker, ask your health care provider if you should have a one-time screening for abdominal aortic aneurysm (AAA). ?Diabetes ?Have regular diabetes screenings. This checks your fasting blood sugar level. Have the screening done: ?Once every three years after age 43 if you are at a normal weight and have a low risk for diabetes. ?More often and at a younger age if you are overweight or have a high risk for diabetes. ?What should I know about preventing infection? ?Hepatitis B ?If you have a higher risk for hepatitis B, you should be screened for this virus. Talk with your health care provider to find out if you are at risk for hepatitis B infection. ?Hepatitis C ?Blood testing is recommended for: ?Everyone born from [birth year] through 1964. ?Anyone with known risk factors for  hepatitis C. ?Sexually transmitted infections (STIs) ?You should be screened each year for STIs, including gonorrhea and chlamydia, if: ?You are sexually active and are younger than 55 years of age. ?You are older than 55 years of age and your health care provider tells you that you are at risk for this type of infection. ?Your sexual activity has changed since  you were last screened, and you are at increased risk for chlamydia or gonorrhea. Ask your health care provider if you are at risk. ?Ask your health care provider about whether you are at high risk for HIV. Your health care provider may recommend a prescription medicine to help prevent HIV infection. If you choose to take medicine to prevent HIV, you should first get tested for HIV. You should then be tested every 3 months for as long as you are taking the medicine. ?Follow these instructions at home: ?Alcohol use ?Do not drink alcohol if your health care provider tells you not to drink. ?If you drink alcohol: ?Limit how much you have to 0-2 drinks a day. ?Know how much alcohol is in your drink. In the [place] one drink equals one 12 oz bottle of beer (355 mL), one 5 oz glass of wine (148 mL), or one 1? oz glass of hard liquor (44 mL). ?Lifestyle ?Do not use any products that contain nicotine or tobacco. These products include cigarettes, chewing tobacco, and vaping devices, such as e-cigarettes. If you need help quitting, ask your health care provider. ?Do not use street drugs. ?Do not share needles. ?Ask your health care provider for help if you need support or information about quitting drugs. ?General instructions ?Schedule regular health, dental, and eye exams. ?Stay current with your vaccines. ?Tell your health care provider if: ?You often feel depressed. ?You have ever been abused or do not feel safe at home. ?Summary ?Adopting a healthy lifestyle and getting preventive care are important in promoting health and wellness. ?Follow your health care provider's instructions about healthy diet, exercising, and getting tested or screened for diseases. ?Follow your health care provider's instructions on monitoring your cholesterol and blood pressure. ?This information is not intended to replace advice given to you by your health care provider. Make sure you discuss any questions you have with your health care  provider. ?Document Revised: 04/15/2021 Document Reviewed: 04/15/2021 ?Elsevier Patient Education ? 2022 [employer] ? ?

## 2022-02-14 NOTE — Progress Notes (Signed)
Patient ID: Derrick Young, male    DOB: [birth year], 56 y.o.   MRN: [id number]  This visit was conducted in person.  BP 122/72    Pulse 72    Temp 97.9 F (36.6 C) (Temporal)    Ht 5\' 9"  (1.753 m)    Wt 167 lb (75.8 kg)    SpO2 96%    BMI 24.66 kg/m    CC: CPE Subjective:   HPI: Derrick Young is a 56 y.o. male presenting on 02/14/2022 for Annual Exam   Recent dx secondary syphilis treated with PCN G 2.59M units IM (08/2021). Titer at that time 1:16. Saw [facility] in interim (Dr 10-12-1980).   Weight gain noted in the past 6 months - Ramadan coming up, also more active in spring/summer months.   Preventative: COLONOSCOPY 12/2019 - benign polyp, rpt 10 yrs [facility]) Prostate cancer screening - continue yearly screening with PSA  Fmhx lung cancer (father age 22), pt smoker. Continues lung cancer screening program (started 2022). >30 PY hx.  Flu shot yearly - declines  COVID vaccine Pfizer 07/2020 x2, booster 12/2020  Tdap 2016 Meningococcal 04/2018  shingrix - discussed, interested  Seat belt use discussed. Sunscreen use discussed. No changing moles on the skin.  Smoking - stopped 11/2017. Down to E-cig. 30+ PY hx. Continues vaping (1 cartridge/day).  Alcohol - none  Rec drugs - none Dentist q3-4 mo Eye exam - not seen since 2020 lasik surgery   From 2021 Caffeine: 4-5 cups coffee/day  Lives with wife, 3 children, 1 cat Occupation: [place], [employer]: PhD  Activity: enjoys soccer, no regular exercise currently  Diet: seldom water, fruits/vegetables daily, seldom fast food, fish occasional      Relevant past medical, surgical, family and social history reviewed and updated as indicated. Interim medical history since our last visit reviewed. Allergies and medications reviewed and updated. Outpatient Medications Prior to Visit  Medication Sig Dispense Refill   cholecalciferol (VITAMIN D3) 25 MCG (1000 UNIT) tablet Take 1,000 Units by mouth daily.     ibuprofen  (ADVIL,MOTRIN) 200 MG tablet Take 200 mg by mouth every 6 (six) hours as needed.     zinc gluconate 50 MG tablet Take 50 mg by mouth daily.     atorvastatin (LIPITOR) 40 MG tablet Take 1 tablet (40 mg total) by mouth at bedtime. 90 tablet 3   Multiple Vitamins-Minerals (AIRBORNE PO) Take 1 tablet by mouth daily.     nitroGLYCERIN (NITROSTAT) 0.4 MG SL tablet Place 1 tablet (0.4 mg total) under the tongue every 5 (five) minutes x 3 doses as needed for chest pain. 30 tablet 1   fluconazole (DIFLUCAN) 150 MG tablet Take 2 tablets (300 mg total) by mouth once a week. For 2 weeks 4 tablet 0   hydrocortisone (ANUSOL-HC) 2.5 % rectal cream Place 1 application rectally 2 (two) times daily. 30 g 0   triamcinolone cream (KENALOG) 0.1 % Apply topically 2 (two) times daily. (Patient not taking: Reported on 08/09/2021)     No facility-administered medications prior to visit.     Per HPI unless specifically indicated in ROS section below Review of Systems  Constitutional:  Negative for activity change, appetite change, chills, fatigue, fever and unexpected weight change.  HENT:  Negative for hearing loss.   Eyes:  Negative for visual disturbance.  Respiratory:  Negative for cough, chest tightness, shortness of breath and wheezing.   Cardiovascular:  Negative for chest pain, palpitations and leg swelling.  Gastrointestinal:  Negative for abdominal distention, abdominal pain, blood in stool, constipation, diarrhea, nausea and vomiting.  Genitourinary:  Negative for difficulty urinating and hematuria.  Musculoskeletal:  Negative for arthralgias, myalgias and neck pain.  Skin:  Negative for rash.  Neurological:  Negative for dizziness, seizures, syncope and headaches.  Hematological:  Negative for adenopathy. Does not bruise/bleed easily.  Psychiatric/Behavioral:  Negative for dysphoric mood. The patient is not nervous/anxious.    Objective:  BP 122/72    Pulse 72    Temp 97.9 F (36.6 C) (Temporal)    Ht  5\' 9"  (1.753 m)    Wt 167 lb (75.8 kg)    SpO2 96%    BMI 24.66 kg/m   Wt Readings from Last 3 Encounters:  02/14/22 167 lb (75.8 kg)  02/04/22 162 lb (73.5 kg)  08/09/21 146 lb (66.2 kg)      Physical Exam Vitals and nursing note reviewed.  Constitutional:      General: He is not in acute distress.    Appearance: Normal appearance. He is well-developed. He is not ill-appearing.  HENT:     Head: Normocephalic and atraumatic.     Right Ear: Hearing, tympanic membrane, ear canal and external ear normal.     Left Ear: Hearing, tympanic membrane, ear canal and external ear normal.  Eyes:     General: No scleral icterus.    Extraocular Movements: Extraocular movements intact.     Conjunctiva/sclera: Conjunctivae normal.     Pupils: Pupils are equal, round, and reactive to light.  Neck:     Thyroid: No thyroid mass or thyromegaly.  Cardiovascular:     Rate and Rhythm: Normal rate and regular rhythm.     Pulses: Normal pulses.          Radial pulses are 2+ on the right side and 2+ on the left side.     Heart sounds: Normal heart sounds. No murmur heard. Pulmonary:     Effort: Pulmonary effort is normal. No respiratory distress.     Breath sounds: Normal breath sounds. No wheezing, rhonchi or rales.  Abdominal:     General: Bowel sounds are normal. There is no distension.     Palpations: Abdomen is soft. There is no mass.     Tenderness: There is no abdominal tenderness. There is no guarding or rebound.     Hernia: No hernia is present.  Musculoskeletal:        General: Normal range of motion.     Cervical back: Normal range of motion and neck supple.     Right lower leg: No edema.     Left lower leg: No edema.  Lymphadenopathy:     Cervical: No cervical adenopathy.  Skin:    General: Skin is warm and dry.     Findings: No rash.  Neurological:     General: No focal deficit present.     Mental Status: He is alert and oriented to person, place, and time.  Psychiatric:         Mood and Affect: Mood normal.        Behavior: Behavior normal.        Thought Content: Thought content normal.        Judgment: Judgment normal.      Results for orders placed or performed in visit on 02/14/22  POCT Urinalysis Dipstick (Automated)  Result Value Ref Range   Color, UA dark yellow    Clarity, UA clear    Glucose, UA Negative  Negative   Bilirubin, UA 1+    Ketones, UA negative    Spec Grav, UA >=1.030 (A) 1.010 - 1.025   Blood, UA negative    pH, UA 6.0 5.0 - 8.0   Protein, UA Negative Negative   Urobilinogen, UA 0.2 0.2 or 1.0 E.U./dL   Nitrite, UA negative    Leukocytes, UA Negative Negative    Assessment & Plan:  This visit occurred during the SARS-CoV-2 public health emergency.  Safety protocols were in place, including screening questions prior to the visit, additional usage of staff PPE, and extensive cleaning of exam room while observing appropriate contact time as indicated for disinfecting solutions.   Problem List Items Addressed This Visit     Healthcare maintenance - Primary (Chronic)    Preventative protocols reviewed and updated unless pt declined. Discussed healthy diet and lifestyle.       Smoker    Quit tobacco 2018.  Continues vaping. Now participating in lung cancer screening program.       Relevant Orders   POCT Urinalysis Dipstick (Automated) (Completed)   Dyslipidemia    Chronic, mild, doing well on atorvastatin 40mg  nightly. ?TIA 2021 The ASCVD Risk score (Arnett DK, et al., 2019) failed to calculate for the following reasons:   The patient has a prior MI or stroke diagnosis       Relevant Medications   atorvastatin (LIPITOR) 40 MG tablet   TIA (transient ischemic attack)    Continue statin. He is not taking aspirin.       Relevant Medications   atorvastatin (LIPITOR) 40 MG tablet   Emphysema lung (HCC)    Pt asxs.       Secondary syphilis in male    S/p treatment.  Initial titers 1:16, rpt RPR today normal.  Will  repeat RPR in 6-12 months.       Relevant Orders   POCT Urinalysis Dipstick (Automated) (Completed)   Kidney lesion, native, right    Incidentally found on lung cancer screening CT, thought exophytic cyst. Check UA today.       Other Visit Diagnoses     Need for shingles vaccine       Relevant Orders   Varicella-zoster vaccine IM (Completed)        Meds ordered this encounter  Medications   atorvastatin (LIPITOR) 40 MG tablet    Sig: Take 1 tablet (40 mg total) by mouth at bedtime.    Dispense:  90 tablet    Refill:  3   Ascorbic Acid (VITAMIN C) 500 MG CAPS    Sig: Take 1 capsule by mouth daily.   Orders Placed This Encounter  Procedures   Varicella-zoster vaccine IM   POCT Urinalysis Dipstick (Automated)     Patient instructions: Start shingrix vaccine today. Return in 2-6 months for nurse visit to complete series.  Increase water intake, slowly taper off caffeinated beverages.  You are doing well today. Return as needed or in 1 year for next physical.   Follow up plan: Return in about 1 year (around 02/15/2023) for annual exam, prior fasting for blood work.  Eustaquio BoydenJavier Makaya Juneau, MD

## 2022-02-14 NOTE — Assessment & Plan Note (Addendum)
Quit tobacco 2018.  ?Continues vaping. ?Now participating in lung cancer screening program.  ?

## 2022-05-27 DIAGNOSIS — M9903 Segmental and somatic dysfunction of lumbar region: Secondary | ICD-10-CM | POA: Diagnosis not present

## 2022-05-27 DIAGNOSIS — R293 Abnormal posture: Secondary | ICD-10-CM | POA: Diagnosis not present

## 2022-05-27 DIAGNOSIS — M9902 Segmental and somatic dysfunction of thoracic region: Secondary | ICD-10-CM | POA: Diagnosis not present

## 2022-05-27 DIAGNOSIS — M62830 Muscle spasm of back: Secondary | ICD-10-CM | POA: Diagnosis not present

## 2022-05-27 DIAGNOSIS — M62451 Contracture of muscle, right thigh: Secondary | ICD-10-CM | POA: Diagnosis not present

## 2022-05-27 DIAGNOSIS — M62452 Contracture of muscle, left thigh: Secondary | ICD-10-CM | POA: Diagnosis not present

## 2022-05-29 DIAGNOSIS — M9903 Segmental and somatic dysfunction of lumbar region: Secondary | ICD-10-CM | POA: Diagnosis not present

## 2022-05-29 DIAGNOSIS — M62451 Contracture of muscle, right thigh: Secondary | ICD-10-CM | POA: Diagnosis not present

## 2022-05-29 DIAGNOSIS — R293 Abnormal posture: Secondary | ICD-10-CM | POA: Diagnosis not present

## 2022-05-29 DIAGNOSIS — M62452 Contracture of muscle, left thigh: Secondary | ICD-10-CM | POA: Diagnosis not present

## 2022-05-29 DIAGNOSIS — M62830 Muscle spasm of back: Secondary | ICD-10-CM | POA: Diagnosis not present

## 2022-05-29 DIAGNOSIS — M9902 Segmental and somatic dysfunction of thoracic region: Secondary | ICD-10-CM | POA: Diagnosis not present

## 2022-06-03 DIAGNOSIS — M9903 Segmental and somatic dysfunction of lumbar region: Secondary | ICD-10-CM | POA: Diagnosis not present

## 2022-06-03 DIAGNOSIS — M9902 Segmental and somatic dysfunction of thoracic region: Secondary | ICD-10-CM | POA: Diagnosis not present

## 2022-06-03 DIAGNOSIS — M62452 Contracture of muscle, left thigh: Secondary | ICD-10-CM | POA: Diagnosis not present

## 2022-06-03 DIAGNOSIS — R293 Abnormal posture: Secondary | ICD-10-CM | POA: Diagnosis not present

## 2022-06-03 DIAGNOSIS — M62830 Muscle spasm of back: Secondary | ICD-10-CM | POA: Diagnosis not present

## 2022-06-03 DIAGNOSIS — M62451 Contracture of muscle, right thigh: Secondary | ICD-10-CM | POA: Diagnosis not present

## 2022-06-05 DIAGNOSIS — M62830 Muscle spasm of back: Secondary | ICD-10-CM | POA: Diagnosis not present

## 2022-06-05 DIAGNOSIS — M9902 Segmental and somatic dysfunction of thoracic region: Secondary | ICD-10-CM | POA: Diagnosis not present

## 2022-06-05 DIAGNOSIS — M62451 Contracture of muscle, right thigh: Secondary | ICD-10-CM | POA: Diagnosis not present

## 2022-06-05 DIAGNOSIS — M9903 Segmental and somatic dysfunction of lumbar region: Secondary | ICD-10-CM | POA: Diagnosis not present

## 2022-06-05 DIAGNOSIS — R293 Abnormal posture: Secondary | ICD-10-CM | POA: Diagnosis not present

## 2022-06-05 DIAGNOSIS — M62452 Contracture of muscle, left thigh: Secondary | ICD-10-CM | POA: Diagnosis not present

## 2022-06-12 DIAGNOSIS — M9902 Segmental and somatic dysfunction of thoracic region: Secondary | ICD-10-CM | POA: Diagnosis not present

## 2022-06-12 DIAGNOSIS — M62451 Contracture of muscle, right thigh: Secondary | ICD-10-CM | POA: Diagnosis not present

## 2022-06-12 DIAGNOSIS — M62452 Contracture of muscle, left thigh: Secondary | ICD-10-CM | POA: Diagnosis not present

## 2022-06-12 DIAGNOSIS — M62830 Muscle spasm of back: Secondary | ICD-10-CM | POA: Diagnosis not present

## 2022-06-12 DIAGNOSIS — M9903 Segmental and somatic dysfunction of lumbar region: Secondary | ICD-10-CM | POA: Diagnosis not present

## 2022-06-12 DIAGNOSIS — R293 Abnormal posture: Secondary | ICD-10-CM | POA: Diagnosis not present

## 2022-06-13 DIAGNOSIS — M9902 Segmental and somatic dysfunction of thoracic region: Secondary | ICD-10-CM | POA: Diagnosis not present

## 2022-06-13 DIAGNOSIS — R293 Abnormal posture: Secondary | ICD-10-CM | POA: Diagnosis not present

## 2022-06-13 DIAGNOSIS — M9903 Segmental and somatic dysfunction of lumbar region: Secondary | ICD-10-CM | POA: Diagnosis not present

## 2022-06-13 DIAGNOSIS — M62452 Contracture of muscle, left thigh: Secondary | ICD-10-CM | POA: Diagnosis not present

## 2022-06-13 DIAGNOSIS — M62451 Contracture of muscle, right thigh: Secondary | ICD-10-CM | POA: Diagnosis not present

## 2022-06-13 DIAGNOSIS — M62830 Muscle spasm of back: Secondary | ICD-10-CM | POA: Diagnosis not present

## 2022-06-24 DIAGNOSIS — M9903 Segmental and somatic dysfunction of lumbar region: Secondary | ICD-10-CM | POA: Diagnosis not present

## 2022-06-24 DIAGNOSIS — M62451 Contracture of muscle, right thigh: Secondary | ICD-10-CM | POA: Diagnosis not present

## 2022-06-24 DIAGNOSIS — R293 Abnormal posture: Secondary | ICD-10-CM | POA: Diagnosis not present

## 2022-06-24 DIAGNOSIS — M62830 Muscle spasm of back: Secondary | ICD-10-CM | POA: Diagnosis not present

## 2022-06-24 DIAGNOSIS — M9902 Segmental and somatic dysfunction of thoracic region: Secondary | ICD-10-CM | POA: Diagnosis not present

## 2022-06-24 DIAGNOSIS — M62452 Contracture of muscle, left thigh: Secondary | ICD-10-CM | POA: Diagnosis not present

## 2022-07-01 DIAGNOSIS — R293 Abnormal posture: Secondary | ICD-10-CM | POA: Diagnosis not present

## 2022-07-01 DIAGNOSIS — M62451 Contracture of muscle, right thigh: Secondary | ICD-10-CM | POA: Diagnosis not present

## 2022-07-01 DIAGNOSIS — M9903 Segmental and somatic dysfunction of lumbar region: Secondary | ICD-10-CM | POA: Diagnosis not present

## 2022-07-01 DIAGNOSIS — M62830 Muscle spasm of back: Secondary | ICD-10-CM | POA: Diagnosis not present

## 2022-07-01 DIAGNOSIS — M9902 Segmental and somatic dysfunction of thoracic region: Secondary | ICD-10-CM | POA: Diagnosis not present

## 2022-07-01 DIAGNOSIS — M62452 Contracture of muscle, left thigh: Secondary | ICD-10-CM | POA: Diagnosis not present

## 2022-07-15 DIAGNOSIS — R293 Abnormal posture: Secondary | ICD-10-CM | POA: Diagnosis not present

## 2022-07-15 DIAGNOSIS — M9903 Segmental and somatic dysfunction of lumbar region: Secondary | ICD-10-CM | POA: Diagnosis not present

## 2022-07-15 DIAGNOSIS — M62452 Contracture of muscle, left thigh: Secondary | ICD-10-CM | POA: Diagnosis not present

## 2022-07-15 DIAGNOSIS — M62451 Contracture of muscle, right thigh: Secondary | ICD-10-CM | POA: Diagnosis not present

## 2022-07-15 DIAGNOSIS — M9902 Segmental and somatic dysfunction of thoracic region: Secondary | ICD-10-CM | POA: Diagnosis not present

## 2022-07-15 DIAGNOSIS — M62830 Muscle spasm of back: Secondary | ICD-10-CM | POA: Diagnosis not present

## 2022-07-22 DIAGNOSIS — M9902 Segmental and somatic dysfunction of thoracic region: Secondary | ICD-10-CM | POA: Diagnosis not present

## 2022-07-22 DIAGNOSIS — M62452 Contracture of muscle, left thigh: Secondary | ICD-10-CM | POA: Diagnosis not present

## 2022-07-22 DIAGNOSIS — M62451 Contracture of muscle, right thigh: Secondary | ICD-10-CM | POA: Diagnosis not present

## 2022-07-22 DIAGNOSIS — M9903 Segmental and somatic dysfunction of lumbar region: Secondary | ICD-10-CM | POA: Diagnosis not present

## 2022-07-22 DIAGNOSIS — M62830 Muscle spasm of back: Secondary | ICD-10-CM | POA: Diagnosis not present

## 2022-07-22 DIAGNOSIS — R293 Abnormal posture: Secondary | ICD-10-CM | POA: Diagnosis not present

## 2022-08-01 ENCOUNTER — Encounter: Payer: Self-pay | Admitting: Family Medicine

## 2022-08-01 ENCOUNTER — Ambulatory Visit: Payer: BC Managed Care – PPO | Admitting: Family Medicine

## 2022-08-01 VITALS — BP 118/68 | HR 76 | Temp 97.6°F | Ht 69.0 in | Wt 160.1 lb

## 2022-08-01 DIAGNOSIS — R229 Localized swelling, mass and lump, unspecified: Secondary | ICD-10-CM | POA: Insufficient documentation

## 2022-08-01 DIAGNOSIS — L290 Pruritus ani: Secondary | ICD-10-CM | POA: Diagnosis not present

## 2022-08-01 DIAGNOSIS — Z23 Encounter for immunization: Secondary | ICD-10-CM | POA: Diagnosis not present

## 2022-08-01 NOTE — Assessment & Plan Note (Signed)
Benign exam. No hemorrhoid, fissure or other abnormality appreciated.  Trial hydrocortisone topically PRN, vs desitin or aveeno.  Update if new or worsening symptoms.  Not consistent with pinworm.

## 2022-08-01 NOTE — Assessment & Plan Note (Signed)
Anticipate benign lipoma, reassurance provided, handout provided. To update [place] if enlarging, tender or more bothersome to consider gen surgery evaluation for excision, or let us know if new lumps developing.

## 2022-08-01 NOTE — Progress Notes (Signed)
Patient ID: Derrick Young, male    DOB: 03-16-1966, 56 y.o.   MRN: [id number]  This visit was conducted in person.  BP 118/68   Pulse 76   Temp 97.6 F (36.4 C) (Temporal)   Ht 5\' 9"  (1.753 m)   Wt 160 lb 2 oz (72.6 kg)   SpO2 97%   BMI 23.65 kg/m    CC: R lateral side lump Subjective:   HPI: Derrick Young is a 56 y.o. male presenting on 08/01/2022 for Mass (C/o lump on R side.  Noticed 07/27/22.  Denies any pain. )   5d h/o R lateral side lump under axilla. Not painful, tender, no skin changes.   Anal itching that comes and goes. Manages with soap/water wash with benefit. Irritated feeling when he washes it. No constipation, normal bowel movements. No blood in stool or with wiping. Not worse at night time, it comes and goes.      Relevant past medical, surgical, family and social history reviewed and updated as indicated. Interim medical history since our last visit reviewed. Allergies and medications reviewed and updated. Outpatient Medications Prior to Visit  Medication Sig Dispense Refill   Ascorbic Acid (VITAMIN C) 500 MG CAPS Take 1 capsule by mouth daily.     atorvastatin (LIPITOR) 40 MG tablet Take 1 tablet (40 mg total) by mouth at bedtime. 90 tablet 3   cholecalciferol (VITAMIN D3) 25 MCG (1000 UNIT) tablet Take 1,000 Units by mouth daily.     ibuprofen (ADVIL,MOTRIN) 200 MG tablet Take 200 mg by mouth every 6 (six) hours as needed.     zinc gluconate 50 MG tablet Take 50 mg by mouth daily.     No facility-administered medications prior to visit.     Per HPI unless specifically indicated in ROS section below Review of Systems  Objective:  BP 118/68   Pulse 76   Temp 97.6 F (36.4 C) (Temporal)   Ht 5\' 9"  (1.753 m)   Wt 160 lb 2 oz (72.6 kg)   SpO2 97%   BMI 23.65 kg/m   Wt Readings from Last 3 Encounters:  08/01/22 160 lb 2 oz (72.6 kg)  02/14/22 167 lb (75.8 kg)  02/04/22 162 lb (73.5 kg)      Physical Exam Vitals and nursing note reviewed.   Constitutional:      Appearance: Normal appearance. He is not ill-appearing.  Abdominal:       Comments: Well circumscribed firm mobile lump to R lateral side at ribcage, nontender, no skin change  Genitourinary:    Rectum: Normal. No mass, tenderness, anal fissure, external hemorrhoid or internal hemorrhoid. Normal anal tone.     Comments: Small excess hemorrhoidal tissue L posterior anus without active inflammation  Skin:    General: Skin is warm and dry.     Findings: No erythema or rash.  Neurological:     Mental Status: He is alert.  Psychiatric:        Mood and Affect: Mood normal.        Behavior: Behavior normal.        Assessment & Plan:   Problem List Items Addressed This Visit     Localized skin mass, lump, or swelling - Primary    Anticipate benign lipoma, reassurance provided, handout provided. To update 04/16/22 if enlarging, tender or more bothersome to consider gen surgery evaluation for excision, or let 02/06/22 know if new lumps developing.       Perianal itch  Benign exam. No hemorrhoid, fissure or other abnormality appreciated.  Trial hydrocortisone topically PRN, vs desitin or aveeno.  Update if new or worsening symptoms.  Not consistent with pinworm.       Other Visit Diagnoses     Need for shingles vaccine       Relevant Orders   Varicella-zoster vaccine IM (Completed)        No orders of the defined types were placed in this encounter.  Orders Placed This Encounter  Procedures   Varicella-zoster vaccine IM     Patient instructions: Probable lipoma - see below For itching, may try anusol steroid cream as needed. If worsening, then stop and just use barrier cream like desitin over the counter or moisturizing cream like aveeno or eucerin.   Follow up plan: Return if symptoms worsen or fail to improve.  Derrick Boyden, MD

## 2022-08-01 NOTE — Patient Instructions (Signed)
Probable lipoma - see below For itching, may try anusol steroid cream as needed. If worsening, then stop and just use barrier cream like desitin over the counter or moisturizing cream like aveeno or eucerin.   Lipoma  A lipoma is a noncancerous (benign) tumor that is made up of fat cells. This is a very common type of soft-tissue growth. Lipomas are usually found under the skin (subcutaneous). They may occur in any tissue of the body that contains fat. Common areas for lipomas to appear include the back, arms, shoulders, buttocks, and thighs. Lipomas grow slowly, and they are usually painless. Most lipomas do not cause problems and do not require treatment. What are the causes? The cause of this condition is not known. What increases the risk? You are more likely to develop this condition if: You are 34-73 years old. You have a family history of lipomas. What are the signs or symptoms? A lipoma usually appears as a small, round bump under the skin. In most cases, the lump will: Feel soft or rubbery. Not cause pain or other symptoms. However, if a lipoma is located in an area where it pushes on nerves, it can become painful or cause other symptoms. How is this diagnosed? A lipoma can usually be diagnosed with a physical exam. You may also have tests to confirm the diagnosis and to rule out other conditions. Tests may include: Imaging tests, such as a CT scan or an MRI. Removal of a tissue sample to be looked at under a microscope (biopsy). How is this treated? Treatment for this condition depends on the size of the lipoma and whether it is causing any symptoms. For small lipomas that are not causing problems, no treatment is needed. If a lipoma is bigger or it causes problems, surgery may be done to remove the lipoma. Lipomas can also be removed to improve appearance. Most often, the procedure is done after applying a medicine that numbs the area (local anesthetic). Liposuction may be done  to reduce the size of the lipoma before it is removed through surgery, or it may be done to remove the lipoma. Lipomas are removed with this method to limit incision size and scarring. A liposuction tube is inserted through a small incision into the lipoma, and the contents of the lipoma are removed through the tube with suction. Follow these instructions at home: Watch your lipoma for any changes. Keep all follow-up visits. This is important. Where to find more information OrthoInfo: orthoinfo.aaos.org Contact a health care provider if: Your lipoma becomes larger or hard. Your lipoma becomes painful, red, or increasingly swollen. These could be signs of infection or a more serious condition. Get help right away if: You develop tingling or numbness in an area near the lipoma. This could indicate that the lipoma is causing nerve damage. Summary A lipoma is a noncancerous tumor that is made up of fat cells. Most lipomas do not cause problems and do not require treatment. If a lipoma is bigger or it causes problems, surgery may be done to remove the lipoma. Contact a health care provider if your lipoma becomes larger or hard, or if it becomes painful, red, or increasingly swollen. These could be signs of infection or a more serious condition. This information is not intended to replace advice given to you by your health care provider. Make sure you discuss any questions you have with your health care provider. Document Revised: 12/13/2021 Document Reviewed: 12/13/2021 Elsevier Patient Education  2023 [employer].

## 2022-08-07 DIAGNOSIS — R293 Abnormal posture: Secondary | ICD-10-CM | POA: Diagnosis not present

## 2022-08-07 DIAGNOSIS — M9903 Segmental and somatic dysfunction of lumbar region: Secondary | ICD-10-CM | POA: Diagnosis not present

## 2022-08-07 DIAGNOSIS — M62451 Contracture of muscle, right thigh: Secondary | ICD-10-CM | POA: Diagnosis not present

## 2022-08-07 DIAGNOSIS — M9902 Segmental and somatic dysfunction of thoracic region: Secondary | ICD-10-CM | POA: Diagnosis not present

## 2022-08-07 DIAGNOSIS — M62452 Contracture of muscle, left thigh: Secondary | ICD-10-CM | POA: Diagnosis not present

## 2022-08-07 DIAGNOSIS — M62830 Muscle spasm of back: Secondary | ICD-10-CM | POA: Diagnosis not present

## 2022-08-12 DIAGNOSIS — M9902 Segmental and somatic dysfunction of thoracic region: Secondary | ICD-10-CM | POA: Diagnosis not present

## 2022-08-12 DIAGNOSIS — R293 Abnormal posture: Secondary | ICD-10-CM | POA: Diagnosis not present

## 2022-08-12 DIAGNOSIS — M9903 Segmental and somatic dysfunction of lumbar region: Secondary | ICD-10-CM | POA: Diagnosis not present

## 2022-08-12 DIAGNOSIS — M62452 Contracture of muscle, left thigh: Secondary | ICD-10-CM | POA: Diagnosis not present

## 2022-08-12 DIAGNOSIS — M62830 Muscle spasm of back: Secondary | ICD-10-CM | POA: Diagnosis not present

## 2022-08-12 DIAGNOSIS — M62451 Contracture of muscle, right thigh: Secondary | ICD-10-CM | POA: Diagnosis not present

## 2022-08-20 ENCOUNTER — Ambulatory Visit: Payer: BC Managed Care – PPO

## 2022-08-28 DIAGNOSIS — M9903 Segmental and somatic dysfunction of lumbar region: Secondary | ICD-10-CM | POA: Diagnosis not present

## 2022-08-28 DIAGNOSIS — M62452 Contracture of muscle, left thigh: Secondary | ICD-10-CM | POA: Diagnosis not present

## 2022-08-28 DIAGNOSIS — M62830 Muscle spasm of back: Secondary | ICD-10-CM | POA: Diagnosis not present

## 2022-08-28 DIAGNOSIS — R293 Abnormal posture: Secondary | ICD-10-CM | POA: Diagnosis not present

## 2022-08-28 DIAGNOSIS — M9902 Segmental and somatic dysfunction of thoracic region: Secondary | ICD-10-CM | POA: Diagnosis not present

## 2022-08-28 DIAGNOSIS — M62451 Contracture of muscle, right thigh: Secondary | ICD-10-CM | POA: Diagnosis not present

## 2022-09-09 ENCOUNTER — Other Ambulatory Visit: Payer: Self-pay

## 2022-09-09 ENCOUNTER — Ambulatory Visit
Admission: EM | Admit: 2022-09-09 | Discharge: 2022-09-09 | Disposition: A | Discharge status: Home / Self Care | Payer: BC Managed Care – PPO | Attending: Emergency Medicine | Admitting: Emergency Medicine

## 2022-09-09 DIAGNOSIS — M62452 Contracture of muscle, left thigh: Secondary | ICD-10-CM | POA: Diagnosis not present

## 2022-09-09 DIAGNOSIS — R293 Abnormal posture: Secondary | ICD-10-CM | POA: Diagnosis not present

## 2022-09-09 DIAGNOSIS — M62830 Muscle spasm of back: Secondary | ICD-10-CM | POA: Diagnosis not present

## 2022-09-09 DIAGNOSIS — M9903 Segmental and somatic dysfunction of lumbar region: Secondary | ICD-10-CM | POA: Diagnosis not present

## 2022-09-09 DIAGNOSIS — L03011 Cellulitis of right finger: Secondary | ICD-10-CM | POA: Diagnosis not present

## 2022-09-09 DIAGNOSIS — M9902 Segmental and somatic dysfunction of thoracic region: Secondary | ICD-10-CM | POA: Diagnosis not present

## 2022-09-09 DIAGNOSIS — M62451 Contracture of muscle, right thigh: Secondary | ICD-10-CM | POA: Diagnosis not present

## 2022-09-09 MED ORDER — DOXYCYCLINE HYCLATE 100 MG PO CAPS: 100.0000 mg | ORAL_CAPSULE | Freq: Two times a day (BID) | ORAL | 0 refills | Status: AC

## 2022-09-09 NOTE — Discharge Instructions (Addendum)
Keep your wound clean and dry.  Wash it gently twice a day with soap and water.  Apply an antibiotic cream and bandage twice a day.    Take the antibiotic as directed.    Follow up with your primary care provider if your symptoms are not improving.

## 2022-09-09 NOTE — ED Provider Notes (Signed)
Derrick Young    CSN: [id number] Arrival date & time: 09/09/22  [id number]      History   Chief Complaint Chief Complaint  Patient presents with   Hand Pain    HPI Derrick Young is a 56 y.o. male.  Patient presents with pain, redness, swelling at the base/side of his right middle finger nail x 1 week.  There is a pus-filled area which he attempted to drain but could not.  No fever, chills, numbness, weakness, paresthesias, or other symptoms. No OTC medications.   The history is provided by the patient and medical records.    Past Medical History:  Diagnosis Date   History of chicken pox    History of nephrolithiasis 2012   Smoker     Patient Active Problem List   Diagnosis Date Noted   Localized skin mass, lump, or swelling 08/01/2022   Perianal itch 08/01/2022   Kidney lesion, native, right 02/14/2022   Secondary syphilis in male 08/09/2021   Aortic atherosclerosis ([place]) 03/28/2021   Emphysema lung ([place]) 03/28/2021   Cervical nerve root impingement 02/17/2020   Chest discomfort 02/09/2020   TIA (transient ischemic attack) 02/08/2020   Chronic left shoulder pain 09/02/2019   Dyslipidemia 08/26/2019   HSV-1 infection 07/05/2018   Tinea versicolor 06/17/2018   Travel advice encounter 06/17/2018   Onychomycosis 01/07/2017   Healthcare maintenance 03/31/2013   Smoker    History of nephrolithiasis     Past Surgical History:  Procedure Laterality Date   COLONOSCOPY  12/2019   benign polyp, rpt 10 yrs [facility])   LITHOTRIPSY  03/2013   ORIF WRIST FRACTURE Left 01/2018   titanium plate in place   REFRACTIVE SURGERY Bilateral 03/2019   Henrene Pastor in [facility] Medications    Prior to Admission medications   Medication Sig Start Date End Date Taking? Authorizing Provider  doxycycline (VIBRAMYCIN) 100 MG capsule Take 1 capsule (100 mg total) by mouth 2 (two) times daily for 7 days. 09/09/22 09/16/22 Yes Sharion Balloon, NP  Ascorbic Acid (VITAMIN C) 500 MG  CAPS Take 1 capsule by mouth daily. 02/14/22   Ria Bush, MD  atorvastatin (LIPITOR) 40 MG tablet Take 1 tablet (40 mg total) by mouth at bedtime. 02/14/22   Ria Bush, MD  cholecalciferol (VITAMIN D3) 25 MCG (1000 UNIT) tablet Take 1,000 Units by mouth daily.    [provider]  ibuprofen (ADVIL,MOTRIN) 200 MG tablet Take 200 mg by mouth every 6 (six) hours as needed.    [provider]  zinc gluconate 50 MG tablet Take 50 mg by mouth daily.    [provider]    Family History Family History  Problem Relation Age of Onset   Cancer Paternal Uncle 70       lung (smoker)   Cancer Paternal Uncle 41       throat   Emphysema Father        smoker   Urolithiasis Mother    CAD Neg Hx    Stroke Neg Hx    Diabetes Neg Hx    Hypertension Neg Hx     Social History Social History   Tobacco Use   Smoking status: Former    Packs/day: 1.00    Years: 34.00    Total pack years: 34.00    Types: E-cigarettes, Cigarettes    Quit date: 2018    Years since quitting: 5.7   Smokeless tobacco: Never  Vaping Use  Vaping Use: Every day  Substance Use Topics   Alcohol use: No   Drug use: No     Allergies   Patient has no known allergies.   Review of Systems Review of Systems  Constitutional:  Negative for chills and fever.  Musculoskeletal:  Positive for arthralgias and joint swelling.  Skin:  Positive for color change and wound.  Neurological:  Negative for weakness and numbness.  All other systems reviewed and are negative.    Physical Exam Triage Vital Signs ED Triage Vitals  Enc Vitals Group     BP      Pulse      Resp      Temp      Temp src      SpO2      Weight      Height      Head Circumference      Peak Flow      Pain Score      Pain Loc      Pain Edu?      Excl. in GC?    No data found.  Updated Vital Signs BP 115/72   Pulse 72   Temp 98.1 F (36.7 C)   Resp 18   Ht 5\' 9"  (1.753 m)   Wt 157 lb (71.2 kg)    SpO2 97%   BMI 23.18 kg/m   Visual Acuity Right Eye Distance:   Left Eye Distance:   Bilateral Distance:    Right Eye Near:   Left Eye Near:    Bilateral Near:     Physical Exam Vitals and nursing note reviewed.  Constitutional:      General: He is not in acute distress.    Appearance: Normal appearance. He is well-developed. He is not ill-appearing.  HENT:     Mouth/Throat:     Mouth: Mucous membranes are moist.  Cardiovascular:     Rate and Rhythm: Normal rate and regular rhythm.     Heart sounds: Normal heart sounds.  Pulmonary:     Effort: Pulmonary effort is normal. No respiratory distress.     Breath sounds: Normal breath sounds.  Musculoskeletal:        General: Swelling and tenderness present. No deformity. Normal range of motion.     Cervical back: Neck supple.  Skin:    General: Skin is warm and dry.     Capillary Refill: Capillary refill takes less than 2 seconds.     Findings: Erythema present.     Comments: Paronychia at side and base of right middle fingernail. No open wound or drainage.  Neurological:     General: No focal deficit present.     Mental Status: He is alert and oriented to person, place, and time.     Sensory: No sensory deficit.     Motor: No weakness.  Psychiatric:        Mood and Affect: Mood normal.        Behavior: Behavior normal.      UC Treatments / Results  Labs (all labs ordered are listed, but only abnormal results are displayed) Labs Reviewed - No data to display  EKG   Radiology No results found.  Procedures Incision and Drainage  Date/Time: 09/09/2022 9:12 AM  Performed by: 11/09/2022, NP Authorized by: Mickie Bail, NP   Consent:    Consent obtained:  Verbal   Consent given by:  Patient   Risks discussed:  Bleeding, incomplete drainage, pain and  infection Universal protocol:    Procedure explained and questions answered to patient or proxy's satisfaction: yes   Location:    Indications for incision  and drainage: paronychia.   Location:  Upper extremity   Upper extremity location:  Finger   Finger location:  R long finger Pre-procedure details:    Skin preparation:  Povidone-iodine Anesthesia:    Anesthesia method:  Local infiltration   Local anesthetic:  Lidocaine 1% w/o epi Procedure type:    Complexity:  Simple Procedure details:    Incision types:  Single straight   Drainage:  Purulent   Drainage amount:  Moderate   Wound treatment:  Wound left open   Packing materials:  None Post-procedure details:    Procedure completion:  Tolerated well, no immediate complications  (including critical care time)  Medications Ordered in UC Medications - No data to display  Initial Impression / Assessment and Plan / UC Course  I have reviewed the triage vital signs and the nursing notes.  Pertinent labs & imaging results that were available during my care of the patient were reviewed by me and considered in my medical decision making (see chart for details).   Paronychia of right middle finger.  I&D performed with moderate purulent drainage.  Treating with doxycycline.  Wound care instructions and signs of worsening infection discussed.  Instructed patient to follow-up with his PCP if his symptoms are not improving.  He agrees to plan of care.   Final Clinical Impressions(s) / UC Diagnoses   Final diagnoses:  Paronychia of right middle finger     Discharge Instructions      Keep your wound clean and dry.  Wash it gently twice a day with soap and water.  Apply an antibiotic cream and bandage twice a day.    Take the antibiotic as directed.    Follow up with your primary care provider if your symptoms are not improving.          ED Prescriptions     Medication Sig Dispense Auth. Provider   doxycycline (VIBRAMYCIN) 100 MG capsule Take 1 capsule (100 mg total) by mouth 2 (two) times daily for 7 days. 14 capsule Mickie Bail, NP      PDMP not reviewed this  encounter.   Mickie Bail, NP 09/09/22 [phone]

## 2022-09-09 NOTE — ED Triage Notes (Signed)
Patient to Urgent Care with complaints of right middle finger pain and swelling. No drainage. Patient has attempted to drain it manually. Patient's entire right hand now swollen. Symptoms started a week ago.

## 2023-02-04 ENCOUNTER — Ambulatory Visit
Admission: RE | Admit: 2023-02-04 | Discharge: 2023-02-04 | Disposition: A | Discharge status: Home / Self Care | Payer: BC Managed Care – PPO | Source: Ambulatory Visit | Attending: Acute Care | Admitting: Acute Care

## 2023-02-04 DIAGNOSIS — J439 Emphysema, unspecified: Secondary | ICD-10-CM | POA: Diagnosis not present

## 2023-02-04 DIAGNOSIS — Z87891 Personal history of nicotine dependence: Secondary | ICD-10-CM | POA: Diagnosis not present

## 2023-02-04 DIAGNOSIS — Z122 Encounter for screening for malignant neoplasm of respiratory organs: Secondary | ICD-10-CM | POA: Insufficient documentation

## 2023-02-05 ENCOUNTER — Other Ambulatory Visit: Payer: Self-pay | Admitting: Acute Care

## 2023-02-05 DIAGNOSIS — Z87891 Personal history of nicotine dependence: Secondary | ICD-10-CM

## 2023-02-05 DIAGNOSIS — Z122 Encounter for screening for malignant neoplasm of respiratory organs: Secondary | ICD-10-CM

## 2023-02-07 ENCOUNTER — Other Ambulatory Visit: Payer: Self-pay | Admitting: Family Medicine

## 2023-02-07 DIAGNOSIS — N289 Disorder of kidney and ureter, unspecified: Secondary | ICD-10-CM

## 2023-02-07 DIAGNOSIS — Z8619 Personal history of other infectious and parasitic diseases: Secondary | ICD-10-CM

## 2023-02-07 DIAGNOSIS — E785 Hyperlipidemia, unspecified: Secondary | ICD-10-CM

## 2023-02-07 DIAGNOSIS — Z125 Encounter for screening for malignant neoplasm of prostate: Secondary | ICD-10-CM

## 2023-02-10 ENCOUNTER — Other Ambulatory Visit: Payer: BC Managed Care – PPO

## 2023-02-13 ENCOUNTER — Other Ambulatory Visit (INDEPENDENT_AMBULATORY_CARE_PROVIDER_SITE_OTHER): Payer: BC Managed Care – PPO

## 2023-02-13 DIAGNOSIS — E785 Hyperlipidemia, unspecified: Secondary | ICD-10-CM | POA: Diagnosis not present

## 2023-02-13 DIAGNOSIS — Z125 Encounter for screening for malignant neoplasm of prostate: Secondary | ICD-10-CM | POA: Diagnosis not present

## 2023-02-13 DIAGNOSIS — N289 Disorder of kidney and ureter, unspecified: Secondary | ICD-10-CM | POA: Diagnosis not present

## 2023-02-13 DIAGNOSIS — Z113 Encounter for screening for infections with a predominantly sexual mode of transmission: Secondary | ICD-10-CM | POA: Diagnosis not present

## 2023-02-13 DIAGNOSIS — Z8619 Personal history of other infectious and parasitic diseases: Secondary | ICD-10-CM

## 2023-02-13 LAB — COMPREHENSIVE METABOLIC PANEL
ALT: 10 U/L (ref 0–53)
AST: 14 U/L (ref 0–37)
Albumin: 3.9 g/dL (ref 3.5–5.2)
Alkaline Phosphatase: 45 U/L (ref 39–117)
BUN: 17 mg/dL (ref 6–23)
CO2: 28 mEq/L (ref 19–32)
Calcium: 9.6 mg/dL (ref 8.4–10.5)
Chloride: 107 mEq/L (ref 96–112)
Creatinine, Ser: 0.93 mg/dL (ref 0.40–1.50)
GFR: 92.44 mL/min (ref >60.00)
Glucose, Bld: 88 mg/dL (ref 70–99)
Potassium: 4.4 mEq/L (ref 3.5–5.1)
Sodium: 142 mEq/L (ref 135–145)
Total Bilirubin: 0.8 mg/dL (ref 0.2–1.2)
Total Protein: 6.5 g/dL (ref 6.0–8.3)

## 2023-02-13 LAB — LIPID PANEL
Cholesterol: 198 mg/dL (ref 0–200)
HDL: 53 mg/dL (ref >39.00)
LDL Cholesterol: 115 mg/dL — ABNORMAL HIGH (ref 0–99)
NonHDL: 145.19
Total CHOL/HDL Ratio: 4
Triglycerides: 153 mg/dL — ABNORMAL HIGH (ref 0.0–149.0)
VLDL: 30.6 mg/dL (ref 0.0–40.0)

## 2023-02-13 LAB — URINALYSIS, ROUTINE W REFLEX MICROSCOPIC
Bilirubin Urine: NEGATIVE
Ketones, ur: NEGATIVE
Leukocytes,Ua: NEGATIVE
Nitrite: NEGATIVE
Specific Gravity, Urine: 1.025 (ref 1.000–1.030)
Total Protein, Urine: NEGATIVE
Urine Glucose: NEGATIVE
Urobilinogen, UA: 0.2 (ref 0.0–1.0)
pH: 6 (ref 5.0–8.0)

## 2023-02-13 LAB — PSA: PSA: 0.7 ng/mL (ref 0.10–4.00)

## 2023-02-14 LAB — RPR: RPR Ser Ql: NONREACTIVE

## 2023-02-17 ENCOUNTER — Ambulatory Visit (INDEPENDENT_AMBULATORY_CARE_PROVIDER_SITE_OTHER): Payer: BC Managed Care – PPO | Admitting: Family Medicine

## 2023-02-17 ENCOUNTER — Encounter: Payer: Self-pay | Admitting: Family Medicine

## 2023-02-17 VITALS — BP 120/64 | HR 70 | Temp 97.6°F | Ht 69.75 in | Wt 172.0 lb

## 2023-02-17 DIAGNOSIS — I700 Atherosclerosis of aorta: Secondary | ICD-10-CM

## 2023-02-17 DIAGNOSIS — Z87891 Personal history of nicotine dependence: Secondary | ICD-10-CM | POA: Diagnosis not present

## 2023-02-17 DIAGNOSIS — Z8619 Personal history of other infectious and parasitic diseases: Secondary | ICD-10-CM

## 2023-02-17 DIAGNOSIS — Z87442 Personal history of urinary calculi: Secondary | ICD-10-CM | POA: Diagnosis not present

## 2023-02-17 DIAGNOSIS — J432 Centrilobular emphysema: Secondary | ICD-10-CM

## 2023-02-17 DIAGNOSIS — Z0000 Encounter for general adult medical examination without abnormal findings: Secondary | ICD-10-CM | POA: Diagnosis not present

## 2023-02-17 DIAGNOSIS — E785 Hyperlipidemia, unspecified: Secondary | ICD-10-CM

## 2023-02-17 DIAGNOSIS — G459 Transient cerebral ischemic attack, unspecified: Secondary | ICD-10-CM

## 2023-02-17 MED ORDER — ATORVASTATIN CALCIUM 40 MG PO TABS: 40.0000 mg | ORAL_TABLET | Freq: Every evening | ORAL | 4 refills | Status: DC

## 2023-02-17 NOTE — Assessment & Plan Note (Addendum)
Chronic, he's been off statin for the past 8 months. Reviewed rationale for recommending statin in h/o TIA. Agrees to restart atorvastatin '40mg'$  daily.  The ASCVD Risk score (Arnett DK, et al., 2019) failed to calculate for the following reasons:   Unable to determine if patient is Non-Hispanic African American

## 2023-02-17 NOTE — Progress Notes (Signed)
Patient ID: Derrick Young, male    DOB: [birth year], 57 y.o.   MRN: [id number]  This visit was conducted in person.  BP 120/64   Pulse 70   Temp 97.6 F (36.4 C) (Temporal)   Ht 5' 9.75" (1.772 m)   Wt 172 lb (78 kg)   SpO2 94%   BMI 24.86 kg/m    CC: CPE Subjective:   HPI: Derrick Young is a 57 y.o. male presenting on 02/17/2023 for Annual Exam   HLD - stopped atorvastatin summer 2023. H/o TIA 02/2020.   Preventative: COLONOSCOPY 12/2019 - benign polyp, rpt 10 yrs [facility]) Prostate cancer screening - continue yearly screening with PSA  Fmhx lung cancer (father age 21), pt smoker. Continues lung cancer screening program (started 2022). >30 PY hx.  Flu shot yearly - declines  COVID vaccine Pfizer 07/2020 x2, booster 12/2020  Tdap 2016 Meningococcal 04/2018  shingrix - 02/2022, 07/2022 Seat belt use discussed. Sunscreen use discussed. No changing moles on the skin.  Smoking - stopped 11/2017. Down to E-cig. 30+ PY hx. Continues vaping (<1 cartridge/day).  Alcohol - none  Dentist q3-4 mo Eye exam - not seen since 2020 lasik surgery    From [place] Caffeine: 4-5 cups coffee/day  Lives with wife, 3 children, 1 cat Occupation: Management consultant, [employer]: PhD  Activity: gardening, kayaking Diet: some water, fruits/vegetables daily, seldom fast food, fish occasional, also milk juice and coffee     Relevant past medical, surgical, family and social history reviewed and updated as indicated. Interim medical history since our last visit reviewed. Allergies and medications reviewed and updated. Outpatient Medications Prior to Visit  Medication Sig Dispense Refill   ibuprofen (ADVIL,MOTRIN) 200 MG tablet Take 200 mg by mouth every 6 (six) hours as needed.     atorvastatin (LIPITOR) 40 MG tablet Take 1 tablet (40 mg total) by mouth at bedtime. 90 tablet 3   Ascorbic Acid (VITAMIN C) 500 MG CAPS Take 1 capsule by mouth daily.     cholecalciferol (VITAMIN D3) 25 MCG (1000 UNIT)  tablet Take 1,000 Units by mouth daily.     zinc gluconate 50 MG tablet Take 50 mg by mouth daily.     No facility-administered medications prior to visit.     Per HPI unless specifically indicated in ROS section below Review of Systems  Constitutional:  Negative for activity change, appetite change, chills, fatigue, fever and unexpected weight change.  HENT:  Negative for hearing loss.   Eyes:  Negative for visual disturbance.  Respiratory:  Negative for cough, chest tightness, shortness of breath and wheezing.   Cardiovascular:  Negative for chest pain, palpitations and leg swelling.  Gastrointestinal:  Negative for abdominal distention, abdominal pain, blood in stool, constipation, diarrhea, nausea and vomiting.  Genitourinary:  Negative for difficulty urinating and hematuria.  Musculoskeletal:  Negative for arthralgias, myalgias and neck pain.  Skin:  Negative for rash.  Neurological:  Negative for dizziness, seizures, syncope and headaches.  Hematological:  Negative for adenopathy. Does not bruise/bleed easily.  Psychiatric/Behavioral:  Negative for dysphoric mood. The patient is not nervous/anxious.     Objective:  BP 120/64   Pulse 70   Temp 97.6 F (36.4 C) (Temporal)   Ht 5' 9.75" (1.772 m)   Wt 172 lb (78 kg)   SpO2 94%   BMI 24.86 kg/m   Wt Readings from Last 3 Encounters:  02/17/23 172 lb (78 kg)  09/09/22 157 lb (71.2 kg)  08/01/22 160 lb  2 oz (72.6 kg)      Physical Exam Vitals and nursing note reviewed.  Constitutional:      General: He is not in acute distress.    Appearance: Normal appearance. He is well-developed. He is not ill-appearing.  HENT:     Head: Normocephalic and atraumatic.     Right Ear: Hearing, tympanic membrane, ear canal and external ear normal.     Left Ear: Hearing, tympanic membrane, ear canal and external ear normal.     Mouth/Throat:     Mouth: Mucous membranes are moist.     Pharynx: Oropharynx is clear. No oropharyngeal exudate  or posterior oropharyngeal erythema.  Eyes:     General: No scleral icterus.    Extraocular Movements: Extraocular movements intact.     Conjunctiva/sclera: Conjunctivae normal.     Pupils: Pupils are equal, round, and reactive to light.  Neck:     Thyroid: No thyroid mass or thyromegaly.  Cardiovascular:     Rate and Rhythm: Normal rate and regular rhythm.     Pulses: Normal pulses.          Radial pulses are 2+ on the right side and 2+ on the left side.     Heart sounds: Normal heart sounds. No murmur heard. Pulmonary:     Effort: Pulmonary effort is normal. No respiratory distress.     Breath sounds: Normal breath sounds. No wheezing, rhonchi or rales.  Abdominal:     General: Bowel sounds are normal. There is no distension.     Palpations: Abdomen is soft. There is no mass.     Tenderness: There is no abdominal tenderness. There is no guarding or rebound.     Hernia: No hernia is present.  Musculoskeletal:        General: Normal range of motion.     Cervical back: Normal range of motion and neck supple.     Right lower leg: No edema.     Left lower leg: No edema.  Lymphadenopathy:     Cervical: No cervical adenopathy.  Skin:    General: Skin is warm and dry.     Findings: No rash.  Neurological:     General: No focal deficit present.     Mental Status: He is alert and oriented to person, place, and time.  Psychiatric:        Mood and Affect: Mood normal.        Behavior: Behavior normal.        Thought Content: Thought content normal.        Judgment: Judgment normal.       Results for orders placed or performed in visit on 02/13/23  Urinalysis, Routine w reflex microscopic  Result Value Ref Range   Color, Urine YELLOW Yellow;Lt. Yellow;Straw;Dark Yellow;Amber;Green;Red;Brown   APPearance CLEAR Clear;Turbid;Slightly Cloudy;Cloudy   Specific Gravity, Urine 1.025 1.000 - 1.030   pH 6.0 5.0 - 8.0   Total Protein, Urine NEGATIVE Negative   Urine Glucose NEGATIVE  Negative   Ketones, ur NEGATIVE Negative   Bilirubin Urine NEGATIVE Negative   Hgb urine dipstick TRACE-INTACT (A) Negative   Urobilinogen, UA 0.2 0.0 - 1.0   Leukocytes,Ua NEGATIVE Negative   Nitrite NEGATIVE Negative   WBC, UA 0-2/hpf 0-2/hpf   RBC / HPF 0-2/hpf 0-2/hpf   Mucus, UA Presence of (A) None   Squamous Epithelial / HPF Rare(0-4/hpf) Rare(0-4/hpf)   Bacteria, UA Few(10-50/hpf) (A) None  RPR  Result Value Ref Range   RPR Ser  Ql NON-REACTIVE NON-REACTIVE  PSA  Result Value Ref Range   PSA 0.70 0.10 - 4.00 ng/mL  Comprehensive metabolic panel  Result Value Ref Range   Sodium 142 135 - 145 mEq/L   Potassium 4.4 3.5 - 5.1 mEq/L   Chloride 107 96 - 112 mEq/L   CO2 28 19 - 32 mEq/L   Glucose, Bld 88 70 - 99 mg/dL   BUN 17 6 - 23 mg/dL   Creatinine, Ser 0.93 0.40 - 1.50 mg/dL   Total Bilirubin 0.8 0.2 - 1.2 mg/dL   Alkaline Phosphatase 45 39 - 117 U/L   AST 14 0 - 37 U/L   ALT 10 0 - 53 U/L   Total Protein 6.5 6.0 - 8.3 g/dL   Albumin 3.9 3.5 - 5.2 g/dL   GFR 92.44 >60.00 mL/min   Calcium 9.6 8.4 - 10.5 mg/dL  Lipid panel  Result Value Ref Range   Cholesterol 198 0 - 200 mg/dL   Triglycerides 153.0 (H) 0.0 - 149.0 mg/dL   HDL 53.00 >39.00 mg/dL   VLDL 30.6 0.0 - 40.0 mg/dL   LDL Cholesterol 115 (H) 0 - 99 mg/dL   Total CHOL/HDL Ratio 4    NonHDL 145.19     Assessment & Plan:   Problem List Items Addressed This Visit     Healthcare maintenance (Chronic)    Preventative protocols reviewed and updated unless pt declined. Discussed healthy diet and lifestyle.       Ex-smoker    Ex smoker, continues vaping regularly.       History of nephrolithiasis    Latest lung cancer screening CT showed tiny L kidney stone - encouraged increased water intake.       Dyslipidemia - Primary    Chronic, he's been off statin for the past 8 months. Reviewed rationale for recommending statin in h/o TIA. Agrees to restart atorvastatin '40mg'$  daily.  The ASCVD Risk score  (Arnett DK, et al., 2019) failed to calculate for the following reasons:   Unable to determine if patient is Non-Hispanic African American       Relevant Medications   atorvastatin (LIPITOR) 40 MG tablet   TIA (transient ischemic attack)    H/o this - rec restart statin. Not taking aspirin.      Relevant Medications   atorvastatin (LIPITOR) 40 MG tablet   Aortic atherosclerosis (H[facility])    Restart statin.       Relevant Medications   atorvastatin (LIPITOR) 40 MG tablet   Emphysema lung (H[facility])    Pt asxs.       History of syphilis    F/u RPR 18 months later normal.         Meds ordered this encounter  Medications   atorvastatin (LIPITOR) 40 MG tablet    Sig: Take 1 tablet (40 mg total) by mouth at bedtime.    Dispense:  90 tablet    Refill:  4    No orders of the defined types were placed in this encounter.   Patient Instructions  Restart atorvastatin '40mg'$  daily.  You are doing well today Return as needed or in 1 year for next physical.   Follow up plan: Return in about 1 year (around 02/17/2024) for annual exam, prior fasting for blood work.  Ria Bush, MD

## 2023-02-17 NOTE — Assessment & Plan Note (Signed)
H/o this - rec restart statin. Not taking aspirin.

## 2023-02-17 NOTE — Assessment & Plan Note (Signed)
Restart statin  

## 2023-02-17 NOTE — Assessment & Plan Note (Signed)
F/u RPR 18 months later normal.

## 2023-02-17 NOTE — Assessment & Plan Note (Signed)
Pt asxs.

## 2023-02-17 NOTE — Assessment & Plan Note (Signed)
Ex smoker, continues vaping regularly.

## 2023-02-17 NOTE — Patient Instructions (Addendum)
Restart atorvastatin '40mg'$  daily.  You are doing well today Return as needed or in 1 year for next physical.

## 2023-02-17 NOTE — Assessment & Plan Note (Signed)
Latest lung cancer screening CT showed tiny L kidney stone - encouraged increased water intake.

## 2023-02-17 NOTE — Assessment & Plan Note (Signed)
Preventative protocols reviewed and updated unless pt declined. Discussed healthy diet and lifestyle.  

## 2023-05-28 DIAGNOSIS — Z1331 Encounter for screening for depression: Secondary | ICD-10-CM | POA: Diagnosis not present

## 2023-05-28 DIAGNOSIS — H00015 Hordeolum externum left lower eyelid: Secondary | ICD-10-CM | POA: Diagnosis not present

## 2023-05-28 DIAGNOSIS — A539 Syphilis, unspecified: Secondary | ICD-10-CM | POA: Diagnosis not present

## 2023-06-15 ENCOUNTER — Telehealth: Payer: Self-pay | Admitting: Family Medicine

## 2023-06-15 NOTE — Telephone Encounter (Signed)
Printed letter and placed in Dr. G's box to sign.  

## 2023-06-15 NOTE — Telephone Encounter (Signed)
Patient needs a letter from his doctor stating that he received an physical on 02/17/2023. He needs the letter for his employer.

## 2023-06-15 NOTE — Telephone Encounter (Signed)
Spoke with pt notifying him letter is ready to pick up. Pt expresses his thanks.   [Placed letter at front office.]

## 2023-07-08 DIAGNOSIS — A539 Syphilis, unspecified: Secondary | ICD-10-CM | POA: Diagnosis not present

## 2023-07-29 ENCOUNTER — Ambulatory Visit: Payer: BC Managed Care – PPO | Admitting: Family Medicine

## 2023-07-29 ENCOUNTER — Encounter: Payer: Self-pay | Admitting: Family Medicine

## 2023-07-29 VITALS — BP 114/62 | HR 84 | Temp 98.2°F | Wt 165.4 lb

## 2023-07-29 DIAGNOSIS — M25522 Pain in left elbow: Secondary | ICD-10-CM | POA: Diagnosis not present

## 2023-07-29 DIAGNOSIS — M25512 Pain in left shoulder: Secondary | ICD-10-CM

## 2023-07-29 NOTE — Assessment & Plan Note (Signed)
About a month of left shoulder pain Overall reassuring exam today.  ?RTC tendonitis - provided with RTC injury exercises and resistance band to use. Continue PRN NSAID.  Update if not improving, consider sports medicine evaluation.  Pt agrees with plan.

## 2023-07-29 NOTE — Patient Instructions (Addendum)
Possible tennis elbow on left- do exercises provided, try tennis elbow strap over the counter when active, may try topical anti inflammatory OTC voltaren gel to tender area.  For left shoulder - overall ok exam today. Possibly rotator cuff irritation. Try exercises provided today.  Let us know if worsening - schedule appointment with Dr Patsy Lager.  May take OTC anti inflammatory pills as needed

## 2023-07-29 NOTE — Progress Notes (Signed)
Ph: [phone] Fax: [phone]   Patient ID: Derrick Young, male    DOB: 11-12-1966, 57 y.o.   MRN: [id number]  This visit was conducted in person.  BP 114/62   Pulse 84   Temp 98.2 F (36.8 C) (Temporal)   Wt 165 lb 6.4 oz (75 kg)   SpO2 98%   BMI 23.90 kg/m    CC: left arm/shoulder pain  Subjective:   HPI: Derrick Young is a 57 y.o. male presenting on 07/29/2023 for Pain (Left arm near elbow and left shoulder /Painful when using )   He is right handed.   3-4 wk h/o left shoulder pain - points to top of shoulder, worse with raising arm above head. Having discomfort even at rest over the past 2 weeks. Can be worse at night when trying to sleep.  Denies inciting trauma/injury or falls.  No neck pain, no shooing pain down arm numbness or tingling.  Also with pain to lateral left elbow.  Acutely worse with fishing golfing or other repetitive use exercise.   No h/o elbow or shoulder surgery previously.  H/o L forearm surgery after fracture while playing soccer 2019 - with residual plate  Treating with tylenol, motrin with temporary benefit.      Relevant past medical, surgical, family and social history reviewed and updated as indicated. Interim medical history since our last visit reviewed. Allergies and medications reviewed and updated. Outpatient Medications Prior to Visit  Medication Sig Dispense Refill   atorvastatin (LIPITOR) 40 MG tablet Take 1 tablet (40 mg total) by mouth at bedtime. 90 tablet 4   ibuprofen (ADVIL,MOTRIN) 200 MG tablet Take 200 mg by mouth every 6 (six) hours as needed.     No facility-administered medications prior to visit.     Per HPI unless specifically indicated in ROS section below Review of Systems  Objective:  BP 114/62   Pulse 84   Temp 98.2 F (36.8 C) (Temporal)   Wt 165 lb 6.4 oz (75 kg)   SpO2 98%   BMI 23.90 kg/m   Wt Readings from Last 3 Encounters:  07/29/23 165 lb 6.4 oz (75 kg)  02/17/23 172 lb (78 kg)   09/09/22 157 lb (71.2 kg)      Physical Exam Vitals and nursing note reviewed.  Constitutional:      Appearance: Normal appearance.  Neck:     Comments: FROM cervical neck without midline spine tenderness Musculoskeletal:        General: No swelling or tenderness. Normal range of motion.     Cervical back: Normal range of motion and neck supple. No rigidity or tenderness.     Comments:  Right shoulder WNL Left shoulder exam: No deformity of shoulders on inspection. No pain with palpation of shoulder landmarks. FROM in abduction and forward flexion. No pain or weakness with testing SITS in ext/int rotation. No pain with empty can sign. Neg Yerguson, Speed test. No impingement. No pain with crossover test. No pain with rotation of humeral head in GH joint.   Bilateral elbows FROM  No pain at left olecranon or medial epicondyle Mild discomfort to palpation of lateral epicondyle   Skin:    General: Skin is warm and dry.     Findings: No rash.  Neurological:     Mental Status: He is alert.  Psychiatric:        Mood and Affect: Mood normal.        Behavior: Behavior normal.  Assessment & Plan:   Problem List Items Addressed This Visit     Left shoulder pain - Primary    About a month of left shoulder pain Overall reassuring exam today.  ?RTC tendonitis - provided with RTC injury exercises and resistance band to use. Continue PRN NSAID.  Update if not improving, consider sports medicine evaluation.  Pt agrees with plan.       Left elbow pain    Lateral elbow pain suspect lateral epicondylitis Supportive measures reviewed - tennis elbow strap, topical voltaren, oral NSAIDs, rest.  Update if not improving with treatment.         No orders of the defined types were placed in this encounter.   No orders of the defined types were placed in this encounter.   Patient Instructions  Possible tennis elbow on left- do exercises provided, try tennis elbow strap  over the counter when active, may try topical anti inflammatory OTC voltaren gel to tender area.  For left shoulder - overall ok exam today. Possibly rotator cuff irritation. Try exercises provided today.  Let us know if worsening - schedule appointment with Dr Patsy Lager.  May take OTC anti inflammatory pills as needed  Follow up plan: Return if symptoms worsen or fail to improve.  Derrick Boyden, MD

## 2023-07-29 NOTE — Assessment & Plan Note (Signed)
Lateral elbow pain suspect lateral epicondylitis Supportive measures reviewed - tennis elbow strap, topical voltaren, oral NSAIDs, rest.  Update if not improving with treatment.

## 2023-08-24 DIAGNOSIS — M25512 Pain in left shoulder: Secondary | ICD-10-CM | POA: Diagnosis not present

## 2023-08-24 DIAGNOSIS — G8929 Other chronic pain: Secondary | ICD-10-CM | POA: Diagnosis not present

## 2023-08-24 DIAGNOSIS — M25522 Pain in left elbow: Secondary | ICD-10-CM | POA: Diagnosis not present

## 2023-08-24 DIAGNOSIS — M7712 Lateral epicondylitis, left elbow: Secondary | ICD-10-CM | POA: Diagnosis not present

## 2023-08-24 DIAGNOSIS — M7542 Impingement syndrome of left shoulder: Secondary | ICD-10-CM | POA: Diagnosis not present

## 2023-12-04 ENCOUNTER — Other Ambulatory Visit: Payer: Self-pay | Admitting: Acute Care

## 2023-12-04 DIAGNOSIS — Z122 Encounter for screening for malignant neoplasm of respiratory organs: Secondary | ICD-10-CM

## 2023-12-04 DIAGNOSIS — Z87891 Personal history of nicotine dependence: Secondary | ICD-10-CM

## 2024-02-08 ENCOUNTER — Other Ambulatory Visit: Payer: Self-pay | Admitting: Family Medicine

## 2024-02-08 ENCOUNTER — Ambulatory Visit
Admission: RE | Admit: 2024-02-08 | Discharge: 2024-02-08 | Disposition: A | Discharge status: Home / Self Care | Payer: BC Managed Care – PPO | Source: Ambulatory Visit | Attending: Family Medicine | Admitting: Family Medicine

## 2024-02-08 DIAGNOSIS — Z87891 Personal history of nicotine dependence: Secondary | ICD-10-CM | POA: Insufficient documentation

## 2024-02-08 DIAGNOSIS — Z125 Encounter for screening for malignant neoplasm of prostate: Secondary | ICD-10-CM

## 2024-02-08 DIAGNOSIS — Z122 Encounter for screening for malignant neoplasm of respiratory organs: Secondary | ICD-10-CM | POA: Insufficient documentation

## 2024-02-08 DIAGNOSIS — E785 Hyperlipidemia, unspecified: Secondary | ICD-10-CM

## 2024-02-12 ENCOUNTER — Other Ambulatory Visit (INDEPENDENT_AMBULATORY_CARE_PROVIDER_SITE_OTHER): Payer: BC Managed Care – PPO

## 2024-02-12 DIAGNOSIS — E785 Hyperlipidemia, unspecified: Secondary | ICD-10-CM | POA: Diagnosis not present

## 2024-02-12 DIAGNOSIS — Z125 Encounter for screening for malignant neoplasm of prostate: Secondary | ICD-10-CM

## 2024-02-12 LAB — COMPREHENSIVE METABOLIC PANEL
ALT: 27 U/L (ref 0–53)
AST: 19 U/L (ref 0–37)
Albumin: 4.2 g/dL (ref 3.5–5.2)
Alkaline Phosphatase: 57 U/L (ref 39–117)
BUN: 16 mg/dL (ref 6–23)
CO2: 26 meq/L (ref 19–32)
Calcium: 9.5 mg/dL (ref 8.4–10.5)
Chloride: 107 meq/L (ref 96–112)
Creatinine, Ser: 0.9 mg/dL (ref 0.40–1.50)
GFR: 95.48 mL/min (ref >60.00)
Glucose, Bld: 94 mg/dL (ref 70–99)
Potassium: 4.3 meq/L (ref 3.5–5.1)
Sodium: 142 meq/L (ref 135–145)
Total Bilirubin: 0.5 mg/dL (ref 0.2–1.2)
Total Protein: 6.7 g/dL (ref 6.0–8.3)

## 2024-02-12 LAB — LIPID PANEL
Cholesterol: 131 mg/dL (ref 0–200)
HDL: 41.3 mg/dL (ref >39.00)
LDL Cholesterol: 69 mg/dL (ref 0–99)
NonHDL: 89.37
Total CHOL/HDL Ratio: 3
Triglycerides: 103 mg/dL (ref 0.0–149.0)
VLDL: 20.6 mg/dL (ref 0.0–40.0)

## 2024-02-12 LAB — PSA: PSA: 0.52 ng/mL (ref 0.10–4.00)

## 2024-02-19 ENCOUNTER — Ambulatory Visit: Payer: BC Managed Care – PPO | Admitting: Family Medicine

## 2024-02-19 ENCOUNTER — Encounter: Payer: Self-pay | Admitting: Family Medicine

## 2024-02-19 VITALS — BP 118/64 | HR 68 | Temp 97.7°F | Ht 68.5 in | Wt 170.1 lb

## 2024-02-19 DIAGNOSIS — Z0000 Encounter for general adult medical examination without abnormal findings: Secondary | ICD-10-CM

## 2024-02-19 DIAGNOSIS — Z8673 Personal history of transient ischemic attack (TIA), and cerebral infarction without residual deficits: Secondary | ICD-10-CM

## 2024-02-19 DIAGNOSIS — R52 Pain, unspecified: Secondary | ICD-10-CM | POA: Diagnosis not present

## 2024-02-19 DIAGNOSIS — E785 Hyperlipidemia, unspecified: Secondary | ICD-10-CM

## 2024-02-19 DIAGNOSIS — G459 Transient cerebral ischemic attack, unspecified: Secondary | ICD-10-CM

## 2024-02-19 DIAGNOSIS — J069 Acute upper respiratory infection, unspecified: Secondary | ICD-10-CM

## 2024-02-19 LAB — POCT INFLUENZA A/B
Influenza A, POC: NEGATIVE
Influenza B, POC: NEGATIVE

## 2024-02-19 LAB — POC COVID19 BINAXNOW: SARS Coronavirus 2 Ag: NEGATIVE

## 2024-02-19 MED ORDER — ATORVASTATIN CALCIUM 40 MG PO TABS: 40.0000 mg | ORAL_TABLET | Freq: Every evening | ORAL | 4 refills | Status: AC

## 2024-02-19 NOTE — Assessment & Plan Note (Signed)
 COVID, flu swabs negative in office today. Discussed possible false positive COVID - and to recheck at home if ongoing symptoms with fever over next few days.  Supportive measures reviewed as well as anticipated course of recovery.  Update if not improving as expected

## 2024-02-19 NOTE — Assessment & Plan Note (Signed)
 Continue statin.

## 2024-02-19 NOTE — Progress Notes (Signed)
 Ph: [phone] Fax: [phone]   Patient ID: Derrick Young, male    DOB: May 03, 1966, 58 y.o.   MRN: [id number]  This visit was conducted in person.  BP 118/64   Pulse 68   Temp 97.7 F (36.5 C) (Oral)   Ht 5' 8.5" (1.74 m)   Wt 170 lb 2 oz (77.2 kg)   SpO2 94%   BMI 25.49 kg/m    CC: CPE Subjective:   HPI: Derrick Young is a 58 y.o. male presenting on 02/19/2024 for Annual Exam and Cough (C/o cough, runny nose, nasal congestion and body aches. Sxs started 02/18/24. )   Currently fasting for Ramadan.   H/o TIA 02/2020. Continues atorvastatin.  Punctate L kidney stone incidentally noted on CT lungs along with scattered liver cysts. He's previously had kidney stones.  Hx syphilis s/p treatment (IM PCN x1) with rpt RPR negative - has seen [facility] ID.  1 d h/o congestion, rhinorrhea, body aches. No fevers/chills, cough, ST, HA, abd pain, nausea.  Entire family is sick.   Preventative: COLONOSCOPY 12/2019 - benign polyp, rpt 10 yrs [facility] [place]) Prostate cancer screening - continue yearly screening with PSA  Fmhx lung cancer (father age 74), pt smoker. Continues lung cancer screening program (started 2022). >30 PY hx. Latest CT scan (02/2024) read pending.  Flu shot declined COVID vaccine Pfizer 07/2020 x2, booster 12/2020  Tdap 2016 Meningococcal 04/2018  Shingrix - 02/2022, 07/2022 Seat belt use discussed. Sunscreen use discussed. No changing moles on the skin.  Smoking - stopped 11/2017. Down to E-cig. 30+ PY hx. Continues vaping (<1 cartridge/day) Vuse.  Alcohol - none  Dentist q3-4 mo Eye exam - not seen since 2020 lasik surgery   From [place]  Caffeine: 4-5 cups coffee/day  Lives with wife, 3 children, 1 cat Occupation: Personal assistant, [employer]: PhD  Activity: kayaking, gardening - more active in Spring  Diet: some water, fruits/vegetables daily, seldom fast food, fish occasional - largely prepared meals      Relevant past medical, surgical, family and social  history reviewed and updated as indicated. Interim medical history since our last visit reviewed. Allergies and medications reviewed and updated. Outpatient Medications Prior to Visit  Medication Sig Dispense Refill   ibuprofen (ADVIL,MOTRIN) 200 MG tablet Take 200 mg by mouth every 6 (six) hours as needed.     atorvastatin (LIPITOR) 40 MG tablet Take 1 tablet (40 mg total) by mouth at bedtime. 90 tablet 4   No facility-administered medications prior to visit.     Per HPI unless specifically indicated in ROS section below Review of Systems  Constitutional:  Negative for activity change, appetite change, chills, fatigue, fever and unexpected weight change.  HENT:  Positive for congestion and rhinorrhea. Negative for hearing loss, postnasal drip, sneezing and sore throat.   Eyes:  Negative for visual disturbance.  Respiratory:  Negative for cough, chest tightness, shortness of breath and wheezing.   Cardiovascular:  Negative for chest pain, palpitations and leg swelling.  Gastrointestinal:  Positive for constipation (diet related). Negative for abdominal distention, abdominal pain, blood in stool, diarrhea, nausea and vomiting.  Genitourinary:  Negative for difficulty urinating and hematuria.  Musculoskeletal:  Negative for arthralgias, myalgias and neck pain.  Skin:  Negative for rash.  Neurological:  Negative for dizziness, seizures, syncope and headaches.  Hematological:  Negative for adenopathy. Does not bruise/bleed easily.  Psychiatric/Behavioral:  Negative for dysphoric mood. The patient is not nervous/anxious.     Objective:  BP 118/64  Pulse 68   Temp 97.7 F (36.5 C) (Oral)   Ht 5' 8.5" (1.74 m)   Wt 170 lb 2 oz (77.2 kg)   SpO2 94%   BMI 25.49 kg/m   Wt Readings from Last 3 Encounters:  02/19/24 170 lb 2 oz (77.2 kg)  02/08/24 170 lb (77.1 kg)  07/29/23 165 lb 6.4 oz (75 kg)      Physical Exam Vitals and nursing note reviewed.  Constitutional:      General: He  is not in acute distress.    Appearance: Normal appearance. He is well-developed. He is not ill-appearing.  HENT:     Head: Normocephalic and atraumatic.     Right Ear: Hearing, tympanic membrane, ear canal and external ear normal.     Left Ear: Hearing, tympanic membrane, ear canal and external ear normal.     Mouth/Throat:     Comments: Wearing mask Eyes:     General: No scleral icterus.    Extraocular Movements: Extraocular movements intact.     Conjunctiva/sclera: Conjunctivae normal.     Pupils: Pupils are equal, round, and reactive to light.  Neck:     Thyroid: No thyroid mass or thyromegaly.     Vascular: No carotid bruit.  Cardiovascular:     Rate and Rhythm: Normal rate and regular rhythm.     Pulses: Normal pulses.          Radial pulses are 2+ on the right side and 2+ on the left side.     Heart sounds: Normal heart sounds. No murmur heard. Pulmonary:     Effort: Pulmonary effort is normal. No respiratory distress.     Breath sounds: Normal breath sounds. No wheezing, rhonchi or rales.  Abdominal:     General: Bowel sounds are normal. There is no distension.     Palpations: Abdomen is soft. There is no mass.     Tenderness: There is no abdominal tenderness. There is no guarding or rebound.     Hernia: No hernia is present.  Musculoskeletal:        General: Normal range of motion.     Cervical back: Normal range of motion and neck supple.     Right lower leg: No edema.     Left lower leg: No edema.  Lymphadenopathy:     Cervical: No cervical adenopathy.  Skin:    General: Skin is warm and dry.     Findings: No rash.  Neurological:     General: No focal deficit present.     Mental Status: He is alert and oriented to person, place, and time.  Psychiatric:        Mood and Affect: Mood normal.        Behavior: Behavior normal.        Thought Content: Thought content normal.        Judgment: Judgment normal.       Results for orders placed or performed in visit  on 02/19/24  POCT Influenza A/B   Collection Time: 02/19/24  8:32 AM  Result Value Ref Range   Influenza A, POC Negative Negative   Influenza B, POC Negative Negative  POC COVID-19 BinaxNow   Collection Time: 02/19/24  8:33 AM  Result Value Ref Range   SARS Coronavirus 2 Ag Negative Negative    Assessment & Plan:   Problem List Items Addressed This Visit     Healthcare maintenance - Primary (Chronic)   Preventative protocols reviewed and updated unless pt  declined. Discussed healthy diet and lifestyle.       Dyslipidemia   Chronic, stable. Continue current regimen.  The 10-year ASCVD risk score (Arnett DK, et al., 2019) is: 7.1%   Values used to calculate the score:     Age: 62 years     Sex: Male     Is Non-Hispanic African American: No     Diabetic: No     Tobacco smoker: Yes     Systolic Blood Pressure: 118 mmHg     Is BP treated: No     HDL Cholesterol: 41.3 mg/dL     Total Cholesterol: 131 mg/dL       Relevant Medications   atorvastatin (LIPITOR) 40 MG tablet   TIA (transient ischemic attack)   Continue statin.       Relevant Medications   atorvastatin (LIPITOR) 40 MG tablet   Viral URI   COVID, flu swabs negative in office today. Discussed possible false positive COVID - and to recheck at home if ongoing symptoms with fever over next few days.  Supportive measures reviewed as well as anticipated course of recovery.  Update if not improving as expected      Other Visit Diagnoses       Body aches       Relevant Orders   POC COVID-19 BinaxNow (Completed)   POCT Influenza A/B (Completed)        Meds ordered this encounter  Medications   atorvastatin (LIPITOR) 40 MG tablet    Sig: Take 1 tablet (40 mg total) by mouth at bedtime.    Dispense:  90 tablet    Refill:  4    Orders Placed This Encounter  Procedures   POC COVID-19 BinaxNow    Previously tested for COVID-19:   Yes    Resident in a congregate (group) care setting:   No    Employed in  healthcare setting:   No   POCT Influenza A/B    Patient Instructions  Good to see you today Return as needed or in 1 year for next physical.  You have a viral upper respiratory infection. Antibiotics are not needed for this.  Viral infections usually take 7-10 days to resolve.  The cough can last a few weeks to go away. Push fluids and plenty of rest. Please return if you are not improving as expected, or if you have high fevers (>101.5) or difficulty swallowing or worsening productive cough. Call clinic with questions.  Good to see you today. I Young you start feeling better soon.   Follow up plan: Return in about 1 year (around 02/18/2025) for annual exam, prior fasting for blood work.  [name], MD

## 2024-02-19 NOTE — Patient Instructions (Signed)
 Good to see you today Return as needed or in 1 year for next physical.  You have a viral upper respiratory infection. Antibiotics are not needed for this.  Viral infections usually take 7-10 days to resolve.  The cough can last a few weeks to go away. Push fluids and plenty of rest. Please return if you are not improving as expected, or if you have high fevers (>101.5) or difficulty swallowing or worsening productive cough. Call clinic with questions.  Good to see you today. I hope you start feeling better soon.

## 2024-02-19 NOTE — Assessment & Plan Note (Addendum)
 Chronic, stable. Continue current regimen.  The 10-year ASCVD risk score (Arnett DK, et al., 2019) is: 7.1%   Values used to calculate the score:     Age: 58 years     Sex: Male     Is Non-Hispanic African American: No     Diabetic: No     Tobacco smoker: Yes     Systolic Blood Pressure: 118 mmHg     Is BP treated: No     HDL Cholesterol: 41.3 mg/dL     Total Cholesterol: 131 mg/dL

## 2024-02-19 NOTE — Assessment & Plan Note (Signed)
 Preventative protocols reviewed and updated unless pt declined. Discussed healthy diet and lifestyle.

## 2024-02-22 ENCOUNTER — Other Ambulatory Visit: Payer: Self-pay

## 2024-02-22 DIAGNOSIS — Z87891 Personal history of nicotine dependence: Secondary | ICD-10-CM

## 2024-02-22 DIAGNOSIS — Z122 Encounter for screening for malignant neoplasm of respiratory organs: Secondary | ICD-10-CM

## 2024-11-24 DIAGNOSIS — H43812 Vitreous degeneration, left eye: Secondary | ICD-10-CM | POA: Diagnosis not present

## 2025-02-08 ENCOUNTER — Ambulatory Visit

## 2025-02-24 ENCOUNTER — Other Ambulatory Visit

## 2025-03-03 ENCOUNTER — Encounter: Admitting: Family Medicine
